# Patient Record
Sex: Female | Born: 1960 | Race: White | Hispanic: No | Smoking: Current every day smoker
Health system: Southern US, Community
[De-identification: ages and names within clinical notes are randomized; demographics above are authoritative.]

## PROBLEM LIST (undated history)

## (undated) DIAGNOSIS — F319 Bipolar disorder, unspecified: Secondary | ICD-10-CM

## (undated) DIAGNOSIS — F419 Anxiety disorder, unspecified: Secondary | ICD-10-CM

## (undated) DIAGNOSIS — M1990 Unspecified osteoarthritis, unspecified site: Secondary | ICD-10-CM

## (undated) DIAGNOSIS — F4310 Post-traumatic stress disorder, unspecified: Secondary | ICD-10-CM

## (undated) DIAGNOSIS — J45909 Unspecified asthma, uncomplicated: Secondary | ICD-10-CM

## (undated) DIAGNOSIS — J189 Pneumonia, unspecified organism: Secondary | ICD-10-CM

## (undated) DIAGNOSIS — R42 Dizziness and giddiness: Secondary | ICD-10-CM

## (undated) DIAGNOSIS — R519 Headache, unspecified: Secondary | ICD-10-CM

## (undated) DIAGNOSIS — R0600 Dyspnea, unspecified: Secondary | ICD-10-CM

## (undated) DIAGNOSIS — C801 Malignant (primary) neoplasm, unspecified: Secondary | ICD-10-CM

## (undated) DIAGNOSIS — D649 Anemia, unspecified: Secondary | ICD-10-CM

## (undated) HISTORY — PX: ANKLE FRACTURE SURGERY: SHX122

## (undated) HISTORY — PX: ABDOMINAL HYSTERECTOMY: SHX81

## (undated) HISTORY — PX: FOOT FRACTURE SURGERY: SHX645

## (undated) HISTORY — PX: KNEE SURGERY: SHX244

## (undated) HISTORY — PX: TONSILLECTOMY: SUR1361

---
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
## 2004-12-27 ENCOUNTER — Emergency Department: Payer: Self-pay | Admitting: Emergency Medicine

## 2006-11-24 ENCOUNTER — Emergency Department: Payer: Self-pay

## 2006-11-29 ENCOUNTER — Ambulatory Visit: Payer: Self-pay | Admitting: Nurse Practitioner

## 2007-05-21 ENCOUNTER — Emergency Department: Payer: Self-pay | Admitting: Emergency Medicine

## 2007-12-21 ENCOUNTER — Emergency Department: Payer: Self-pay | Admitting: Emergency Medicine

## 2007-12-23 ENCOUNTER — Emergency Department: Payer: Self-pay | Admitting: Emergency Medicine

## 2008-05-26 ENCOUNTER — Emergency Department: Payer: Self-pay | Admitting: Emergency Medicine

## 2008-08-22 ENCOUNTER — Emergency Department: Payer: Self-pay | Admitting: Emergency Medicine

## 2008-11-27 ENCOUNTER — Emergency Department: Payer: Self-pay | Admitting: Unknown Physician Specialty

## 2010-04-21 ENCOUNTER — Ambulatory Visit: Payer: Self-pay | Admitting: Family Medicine

## 2011-02-25 ENCOUNTER — Emergency Department: Payer: Self-pay | Admitting: Emergency Medicine

## 2011-06-04 ENCOUNTER — Emergency Department: Payer: Self-pay | Admitting: Emergency Medicine

## 2011-06-15 ENCOUNTER — Emergency Department: Payer: Self-pay | Admitting: *Deleted

## 2012-09-28 ENCOUNTER — Emergency Department: Payer: Self-pay | Admitting: Internal Medicine

## 2014-04-14 ENCOUNTER — Emergency Department: Payer: Self-pay | Admitting: Emergency Medicine

## 2014-04-14 LAB — BASIC METABOLIC PANEL
Anion Gap: 6 — ABNORMAL LOW (ref 7–16)
BUN: 9 mg/dL (ref 7–18)
Calcium, Total: 9.1 mg/dL (ref 8.5–10.1)
Chloride: 109 mmol/L — ABNORMAL HIGH (ref 98–107)
Co2: 26 mmol/L (ref 21–32)
Creatinine: 0.9 mg/dL (ref 0.60–1.30)
EGFR (African American): >60
GLUCOSE: 99 mg/dL (ref 65–99)
OSMOLALITY: 280 (ref 275–301)
POTASSIUM: 3.9 mmol/L (ref 3.5–5.1)
SODIUM: 141 mmol/L (ref 136–145)

## 2014-04-14 LAB — CBC
HCT: 37.8 % (ref 35.0–47.0)
HGB: 12.7 g/dL (ref 12.0–16.0)
MCH: 28.1 pg (ref 26.0–34.0)
MCHC: 33.7 g/dL (ref 32.0–36.0)
MCV: 83 fL (ref 80–100)
Platelet: 193 10*3/uL (ref 150–440)
RBC: 4.53 10*6/uL (ref 3.80–5.20)
RDW: 14.3 % (ref 11.5–14.5)
WBC: 7.3 10*3/uL (ref 3.6–11.0)

## 2014-04-14 LAB — TROPONIN I: Troponin-I: <0.02 ng/mL

## 2014-07-19 ENCOUNTER — Emergency Department: Payer: Self-pay | Admitting: Emergency Medicine

## 2015-12-22 ENCOUNTER — Encounter: Payer: Self-pay | Admitting: Emergency Medicine

## 2015-12-22 ENCOUNTER — Emergency Department
Admission: EM | Admit: 2015-12-22 | Discharge: 2015-12-22 | Disposition: A | Discharge status: Home / Self Care | Payer: Self-pay | Attending: Emergency Medicine | Admitting: Emergency Medicine

## 2015-12-22 DIAGNOSIS — F17210 Nicotine dependence, cigarettes, uncomplicated: Secondary | ICD-10-CM | POA: Insufficient documentation

## 2015-12-22 DIAGNOSIS — S61211A Laceration without foreign body of left index finger without damage to nail, initial encounter: Secondary | ICD-10-CM | POA: Insufficient documentation

## 2015-12-22 DIAGNOSIS — Y9289 Other specified places as the place of occurrence of the external cause: Secondary | ICD-10-CM | POA: Insufficient documentation

## 2015-12-22 DIAGNOSIS — S61219A Laceration without foreign body of unspecified finger without damage to nail, initial encounter: Secondary | ICD-10-CM

## 2015-12-22 DIAGNOSIS — Z23 Encounter for immunization: Secondary | ICD-10-CM | POA: Insufficient documentation

## 2015-12-22 DIAGNOSIS — Y9389 Activity, other specified: Secondary | ICD-10-CM | POA: Insufficient documentation

## 2015-12-22 DIAGNOSIS — Y998 Other external cause status: Secondary | ICD-10-CM | POA: Insufficient documentation

## 2015-12-22 DIAGNOSIS — W272XXA Contact with scissors, initial encounter: Secondary | ICD-10-CM | POA: Insufficient documentation

## 2015-12-22 MED ORDER — TETANUS-DIPHTH-ACELL PERTUSSIS 5-2.5-18.5 LF-MCG/0.5 IM SUSP
0.5000 mL | Freq: Once | INTRAMUSCULAR | Status: AC
  Administered 2015-12-22: 0.5 mL via INTRAMUSCULAR

## 2015-12-22 MED ORDER — TETANUS-DIPHTH-ACELL PERTUSSIS 5-2.5-18.5 LF-MCG/0.5 IM SUSP
INTRAMUSCULAR | Status: AC
  Administered 2015-12-22: 0.5 mL via INTRAMUSCULAR
  Filled 2015-12-22: qty 0.5

## 2015-12-22 NOTE — ED Notes (Signed)
Skin glue applied to left pointer finger. Pt in no acute distress at this time. Bleeding under control at this time.

## 2015-12-22 NOTE — Discharge Instructions (Signed)
Laceration Care, Adult A laceration is a cut that goes through all layers of the skin. The cut also goes into the tissue that is right under the skin. Some cuts heal on their own. Others need to be closed with stitches (sutures), staples, skin adhesive strips, or wound glue. Taking care of your cut lowers your risk of infection and helps your cut to heal better. HOW TO TAKE CARE OF YOUR CUT For stitches or staples:  Keep the wound clean and dry.  If you were given a bandage (dressing), you should change it at least one time per day or as told by your doctor. You should also change it if it gets wet or dirty.  Keep the wound completely dry for the first 24 hours or as told by your doctor. After that time, you may take a shower or a bath. However, make sure that the wound is not soaked in water until after the stitches or staples have been removed.  Clean the wound one time each day or as told by your doctor:  Wash the wound with soap and water.  Rinse the wound with water until all of the soap comes off.  Pat the wound dry with a clean towel. Do not rub the wound.  After you clean the wound, put a thin layer of antibiotic ointment on it as told by your doctor. This ointment:  Helps to prevent infection.  Keeps the bandage from sticking to the wound.  Have your stitches or staples removed as told by your doctor. If your doctor used skin adhesive strips:   Keep the wound clean and dry.  If you were given a bandage, you should change it at least one time per day or as told by your doctor. You should also change it if it gets dirty or wet.  Do not get the skin adhesive strips wet. You can take a shower or a bath, but be careful to keep the wound dry.  If the wound gets wet, pat it dry with a clean towel. Do not rub the wound.  Skin adhesive strips fall off on their own. You can trim the strips as the wound heals. Do not remove any strips that are still stuck to the wound. They will  fall off after a while. If your doctor used wound glue:  Try to keep your wound dry, but you may briefly wet it in the shower or bath. Do not soak the wound in water, such as by swimming.  After you take a shower or a bath, gently pat the wound dry with a clean towel. Do not rub the wound.  Do not do any activities that will make you really sweaty until the skin glue has fallen off on its own.  Do not apply liquid, cream, or ointment medicine to your wound while the skin glue is still on.  If you were given a bandage, you should change it at least one time per day or as told by your doctor. You should also change it if it gets dirty or wet.  If a bandage is placed over the wound, do not let the tape for the bandage touch the skin glue.  Do not pick at the glue. The skin glue usually stays on for 5-10 days. Then, it falls off of the skin. General Instructions  To help prevent scarring, make sure to cover your wound with sunscreen whenever you are outside after stitches are removed, after adhesive strips are removed,  or when wound glue stays in place and the wound is healed. Make sure to wear a sunscreen of at least 30 SPF.  Take over-the-counter and prescription medicines only as told by your doctor.  If you were given antibiotic medicine or ointment, take or apply it as told by your doctor. Do not stop using the antibiotic even if your wound is getting better.  Do not scratch or pick at the wound.  Keep all follow-up visits as told by your doctor. This is important.  Check your wound every day for signs of infection. Watch for:  Redness, swelling, or pain.  Fluid, blood, or pus.  Raise (elevate) the injured area above the level of your heart while you are sitting or lying down, if possible. GET HELP IF:  You got a tetanus shot and you have any of these problems at the injection site:  Swelling.  Very bad pain.  Redness.  Bleeding.  You have a fever.  A wound that was  closed breaks open.  You notice a bad smell coming from your wound or your bandage.  You notice something coming out of the wound, such as wood or glass.  Medicine does not help your pain.  You have more redness, swelling, or pain at the site of your wound.  You have fluid, blood, or pus coming from your wound.  You notice a change in the color of your skin near your wound.  You need to change the bandage often because fluid, blood, or pus is coming from the wound.  You start to have a new rash.  You start to have numbness around the wound. GET HELP RIGHT AWAY IF:  You have very bad swelling around the wound.  Your pain suddenly gets worse and is very bad.  You notice painful lumps near the wound or on skin that is anywhere on your body.  You have a red streak going away from your wound.  The wound is on your hand or foot and you cannot move a finger or toe like you usually can.  The wound is on your hand or foot and you notice that your fingers or toes look pale or bluish.   This information is not intended to replace advice given to you by your health care provider. Make sure you discuss any questions you have with your health care provider.   Document Released: 03/13/2008 Document Revised: 02/09/2015 Document Reviewed: 09/21/2014 Elsevier Interactive Patient Education 2016 [place], or Adhesive Wound Closure Doctors use stitches (sutures), staples, and certain glue (skin adhesives) to hold your skin together while it heals (wound closure). You may need this treatment after you have surgery or if you cut your skin accidentally. These methods help your skin heal more quickly. They also make it less likely that you will have a scar. WHAT ARE THE DIFFERENT KINDS OF WOUND CLOSURES? There are many options for wound closure. The one that your doctor uses depends on how deep and large your wound is. Adhesive Glue To use this glue to close a wound, your  doctor holds the edges of the wound together and paints the glue on the surface of your skin. You may need more than one layer of glue. Then the wound may be covered with a light bandage (dressing). This type of skin closure may be used for small wounds that are not deep (superficial). Using glue for wound closure is less painful than other methods. It does not require  a medicine that numbs the area. This method also leaves nothing to be removed. Adhesive glue is often used for children and on facial wounds. Adhesive glue cannot be used for wounds that are deep, uneven, or bleeding. It is not used inside of a wound.  Adhesive Strips These strips are made of sticky (adhesive), porous paper. They are placed across your skin edges like a regular adhesive bandage. You leave them on until they fall off. Adhesive strips may be used to close very superficial wounds. They may also be used along with sutures to improve closure of your skin edges.  Sutures Sutures are the oldest method of wound closure. Sutures can be made from natural or synthetic materials. They can be made from a material that your body can break down as your wound heals (absorbable), or they can be made from a material that needs to be removed from your skin (nonabsorbable). They come in many different strengths and sizes. Your doctor attaches the sutures to a steel needle on one end. Sutures can be passed through your skin, or through the tissues beneath your skin. Then they are tied and cut. Your skin edges may be closed in one continuous stitch or in separate stitches. Sutures are strong and can be used for all kinds of wounds. Absorbable sutures may be used to close tissues under the skin. The disadvantage of sutures is that they may cause skin reactions that lead to infection. Nonabsorbable sutures need to be removed. Staples When surgical staples are used to close a wound, the edges of your skin on both sides of the wound are brought  close together. A staple is placed across the wound, and an instrument secures the edges together. Staples are often used to close surgical cuts (incisions). Staples are faster to use than sutures, and they cause less reaction from your skin. Staples need to be removed using a tool that bends the staples away from your skin. HOW DO I CARE FOR MY WOUND CLOSURE?  Take medicines only as told by your doctor.  If you were prescribed an antibiotic medicine for your wound, finish it all even if you start to feel better.  Use ointments or creams only as told by your doctor.  Wash your hands with soap and water before and after touching your wound.  Do not soak your wound in water. Do not take baths, swim, or use a hot tub until your doctor says it is okay.  Ask your doctor when you can start showering. Cover your wound if told by your doctor.  Do not take out your own sutures or staples.  Do not pick at your wound. Picking can cause an infection.  Keep all follow-up visits as told by your doctor. This is important. HOW LONG WILL I HAVE MY WOUND CLOSURE?   Leave adhesive glue on your skin until the glue peels away.  Leave adhesive strips on your skin until they fall off.  Absorbable sutures will dissolve within several days.  Nonabsorbable sutures and staples must be removed. The location of the wound will determine how long they stay in. This can range from several days to a couple of weeks. WHEN SHOULD I SEEK HELP FOR MY WOUND CLOSURE? Contact your doctor if:  You have a fever.  You have chills.  You have redness, puffiness (swelling), or pain at the site of your wound.  You have fluid, blood, or pus coming from your wound.  There is a bad smell coming  from your wound.  The skin edges of your wound start to separate after your sutures have been removed.  Your wound becomes thick, raised, and darker in color after your sutures come out (scarring).   This information is not  intended to replace advice given to you by your health care provider. Make sure you discuss any questions you have with your health care provider.   Document Released: 07/23/2009 Document Revised: 10/16/2014 Document Reviewed: 03/04/2014 Elsevier Interactive Patient Education [employer].

## 2015-12-22 NOTE — ED Provider Notes (Signed)
[facility] Emergency Department Provider Note ____________________________________________  Time seen: 2250  I have reviewed the triage vital signs and the nursing notes.  HISTORY  Chief Complaint  Laceration  HPI Catherine Pollard is a 55 y.o. female since the ED for evaluation of a simple laceration to left index finger. She describes an accidental, self-inflicted laceration to the lateral aspect of her left index finger with a pair of scissors. She initially had difficulty getting the wound to stop bleeding, but now reports the wound is not actively bleeding. She is also unclear of her tetanus status. She denies any other injury at this time. She reportsdiscomfort a 4/10 in triage.  History reviewed. No pertinent past medical history.  There are no active problems to display for this patient.  History reviewed. No pertinent past surgical history.  No current outpatient prescriptions on file.  Allergies Review of patient's allergies indicates no known allergies.  History reviewed. No pertinent family history.  Social History Social History  Substance Use Topics  . Smoking status: Current Every Day Smoker -- 0.50 packs/day    Types: Cigarettes  . Smokeless tobacco: None  . Alcohol Use: None   Review of Systems  Constitutional: Negative for fever. Musculoskeletal: Negative for back pain. Skin: Negative for rash. Left index lack as above. Neurological: Negative for headaches, focal weakness or numbness. ____________________________________________  PHYSICAL EXAM:  VITAL SIGNS: ED Triage Vitals  Enc Vitals Group     BP 12/22/15 2217 109/65 mmHg     Pulse Rate 12/22/15 2217 81     Resp 12/22/15 2217 18     Temp 12/22/15 2217 98.1 F (36.7 C)     Temp Source 12/22/15 2217 Oral     SpO2 12/22/15 2217 97 %     Weight 12/22/15 2217 197 lb 11.2 oz (89.676 kg)     Height 12/22/15 2217 5\' 5"  (1.651 m)     Head Cir --      Peak Flow --       Pain Score 12/22/15 2241 4     Pain Loc --      Pain Edu? --      Excl. in [place]? --    Constitutional: Alert and oriented. Well appearing and in no distress. Head: Normocephalic and atraumatic. Eyes: Conjunctivae are normal. PERRL. Normal extraocular movements Hematological/Lymphatic/Immunological: No cervical lymphadenopathy. Cardiovascular: Normal rate, regular rhythm. Normal distal pulses and normal capillary refill. Respiratory: Normal respiratory effort.  Musculoskeletal: Left index finger with normal flexion and extension range and exam. Nontender with normal range of motion in all extremities.  Neurologic:  Normal gait without ataxia. Normal speech and language. No gross focal neurologic deficits are appreciated. Skin:  Skin is warm, dry and intact. No rash noted. Patient with a simple linear laceration to the lateral aspect of the left index finger at the distal phalanx. The laceration measures approximately 1 cm in length. No active bleeding is noted. Psychiatric: Mood and affect are normal. Patient exhibits appropriate insight and judgment. ___________________________________________  PROCEDURES  Tdap  LACERATION REPAIR Performed by: Melvenia Needles Authorized by: Melvenia Needles Consent: Verbal consent obtained. Risks and benefits: risks, benefits and alternatives were discussed Consent given by: patient Patient identity confirmed: provided demographic data Prepped and Draped in normal sterile fashion Wound explored  Laceration Location: left index  Laceration Length: 1 cm  No Foreign Bodies seen or palpated  Amount of cleaning: standard  Skin closure: wound adhesive  Patient tolerance:  Patient tolerated the procedure well with no immediate complications. ____________________________________________  INITIAL IMPRESSION / ASSESSMENT AND PLAN / ED COURSE  Patient with a simple finger laceration status post wound adhesive repair. She is advised  on wound care struck chin and management including avoidance of oils, ointments, and lotions to the glue. She'll follow-up with primary care provider for ongoing symptom management. ____________________________________________  FINAL CLINICAL IMPRESSION(S) / ED DIAGNOSES  Final diagnoses:  Finger laceration, initial encounter      Melvenia Needles, PA-C 12/22/15 [place], MD 12/23/15 [phone]
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
## 2017-11-10 ENCOUNTER — Emergency Department: Payer: Self-pay

## 2017-11-10 ENCOUNTER — Emergency Department
Admission: EM | Admit: 2017-11-10 | Discharge: 2017-11-10 | Disposition: A | Discharge status: Home / Self Care | Payer: Self-pay | Attending: Emergency Medicine | Admitting: Emergency Medicine

## 2017-11-10 ENCOUNTER — Encounter: Payer: Self-pay | Admitting: Emergency Medicine

## 2017-11-10 ENCOUNTER — Other Ambulatory Visit: Payer: Self-pay

## 2017-11-10 DIAGNOSIS — Y92410 Unspecified street and highway as the place of occurrence of the external cause: Secondary | ICD-10-CM | POA: Insufficient documentation

## 2017-11-10 DIAGNOSIS — Y999 Unspecified external cause status: Secondary | ICD-10-CM | POA: Insufficient documentation

## 2017-11-10 DIAGNOSIS — M7918 Myalgia, other site: Secondary | ICD-10-CM | POA: Insufficient documentation

## 2017-11-10 DIAGNOSIS — F17210 Nicotine dependence, cigarettes, uncomplicated: Secondary | ICD-10-CM | POA: Insufficient documentation

## 2017-11-10 DIAGNOSIS — Y9389 Activity, other specified: Secondary | ICD-10-CM | POA: Insufficient documentation

## 2017-11-10 HISTORY — DX: Dizziness and giddiness: R42

## 2017-11-10 MED ORDER — CYCLOBENZAPRINE HCL 5 MG PO TABS: ORAL_TABLET | ORAL | 0 refills | Status: AC

## 2017-11-10 MED ORDER — KETOROLAC TROMETHAMINE 30 MG/ML IJ SOLN
30.0000 mg | Freq: Once | INTRAMUSCULAR | Status: AC
  Administered 2017-11-10: 30 mg via INTRAMUSCULAR
  Filled 2017-11-10: qty 1

## 2017-11-10 MED ORDER — IBUPROFEN 800 MG PO TABS: 800.0000 mg | ORAL_TABLET | Freq: Three times a day (TID) | ORAL | 0 refills | Status: DC | PRN
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
## 2018-06-12 ENCOUNTER — Ambulatory Visit: Payer: Self-pay

## 2018-09-17 ENCOUNTER — Inpatient Hospital Stay
Admission: EM | Admit: 2018-09-17 | Discharge: 2018-09-18 | DRG: 193 | Disposition: A | Discharge status: Home / Self Care | Payer: Self-pay | Attending: Internal Medicine | Admitting: Internal Medicine

## 2018-09-17 ENCOUNTER — Emergency Department: Payer: Self-pay

## 2018-09-17 ENCOUNTER — Other Ambulatory Visit: Payer: Self-pay

## 2018-09-17 ENCOUNTER — Encounter: Payer: Self-pay | Admitting: Emergency Medicine

## 2018-09-17 DIAGNOSIS — S01512A Laceration without foreign body of oral cavity, initial encounter: Secondary | ICD-10-CM | POA: Diagnosis present

## 2018-09-17 DIAGNOSIS — I493 Ventricular premature depolarization: Secondary | ICD-10-CM | POA: Diagnosis present

## 2018-09-17 DIAGNOSIS — E860 Dehydration: Secondary | ICD-10-CM | POA: Diagnosis present

## 2018-09-17 DIAGNOSIS — E876 Hypokalemia: Secondary | ICD-10-CM | POA: Diagnosis present

## 2018-09-17 DIAGNOSIS — Z885 Allergy status to narcotic agent status: Secondary | ICD-10-CM

## 2018-09-17 DIAGNOSIS — F17210 Nicotine dependence, cigarettes, uncomplicated: Secondary | ICD-10-CM | POA: Diagnosis present

## 2018-09-17 DIAGNOSIS — J9601 Acute respiratory failure with hypoxia: Secondary | ICD-10-CM | POA: Diagnosis present

## 2018-09-17 DIAGNOSIS — R0902 Hypoxemia: Secondary | ICD-10-CM

## 2018-09-17 DIAGNOSIS — F329 Major depressive disorder, single episode, unspecified: Secondary | ICD-10-CM | POA: Diagnosis present

## 2018-09-17 DIAGNOSIS — W1839XA Other fall on same level, initial encounter: Secondary | ICD-10-CM | POA: Diagnosis present

## 2018-09-17 DIAGNOSIS — R55 Syncope and collapse: Secondary | ICD-10-CM | POA: Diagnosis present

## 2018-09-17 DIAGNOSIS — F419 Anxiety disorder, unspecified: Secondary | ICD-10-CM | POA: Diagnosis present

## 2018-09-17 DIAGNOSIS — I959 Hypotension, unspecified: Secondary | ICD-10-CM | POA: Diagnosis present

## 2018-09-17 DIAGNOSIS — J9801 Acute bronchospasm: Secondary | ICD-10-CM | POA: Diagnosis present

## 2018-09-17 DIAGNOSIS — D61818 Other pancytopenia: Secondary | ICD-10-CM | POA: Diagnosis present

## 2018-09-17 DIAGNOSIS — G8929 Other chronic pain: Secondary | ICD-10-CM | POA: Diagnosis present

## 2018-09-17 DIAGNOSIS — Y92002 Bathroom of unspecified non-institutional (private) residence single-family (private) house as the place of occurrence of the external cause: Secondary | ICD-10-CM

## 2018-09-17 DIAGNOSIS — Z79899 Other long term (current) drug therapy: Secondary | ICD-10-CM

## 2018-09-17 DIAGNOSIS — S0083XA Contusion of other part of head, initial encounter: Secondary | ICD-10-CM | POA: Diagnosis present

## 2018-09-17 DIAGNOSIS — J101 Influenza due to other identified influenza virus with other respiratory manifestations: Principal | ICD-10-CM | POA: Diagnosis present

## 2018-09-17 HISTORY — DX: Anxiety disorder, unspecified: F41.9

## 2018-09-17 LAB — INFLUENZA PANEL BY PCR (TYPE A & B)
Influenza A By PCR: POSITIVE — AB
Influenza B By PCR: NEGATIVE

## 2018-09-17 LAB — TROPONIN I: Troponin I: <0.03 ng/mL (ref <0.03)

## 2018-09-17 LAB — BASIC METABOLIC PANEL
Anion gap: 8 (ref 5–15)
BUN: 11 mg/dL (ref 6–20)
CALCIUM: 7.7 mg/dL — AB (ref 8.9–10.3)
CO2: 22 mmol/L (ref 22–32)
CREATININE: 0.96 mg/dL (ref 0.44–1.00)
Chloride: 107 mmol/L (ref 98–111)
GFR calc non Af Amer: >60 mL/min (ref >60)
Glucose, Bld: 117 mg/dL — ABNORMAL HIGH (ref 70–99)
Potassium: 3.3 mmol/L — ABNORMAL LOW (ref 3.5–5.1)
Sodium: 137 mmol/L (ref 135–145)

## 2018-09-17 LAB — CBC
HEMATOCRIT: 36.6 % (ref 36.0–46.0)
Hemoglobin: 11.7 g/dL — ABNORMAL LOW (ref 12.0–15.0)
MCH: 28.1 pg (ref 26.0–34.0)
MCHC: 32 g/dL (ref 30.0–36.0)
MCV: 87.8 fL (ref 80.0–100.0)
NRBC: 0 % (ref 0.0–0.2)
Platelets: 134 10*3/uL — ABNORMAL LOW (ref 150–400)
RBC: 4.17 MIL/uL (ref 3.87–5.11)
RDW: 14.1 % (ref 11.5–15.5)
WBC: 4.4 10*3/uL (ref 4.0–10.5)

## 2018-09-17 MED ORDER — FENTANYL CITRATE (PF) 100 MCG/2ML IJ SOLN
50.0000 ug | Freq: Once | INTRAMUSCULAR | Status: AC
  Administered 2018-09-17: 50 ug via INTRAVENOUS
  Filled 2018-09-17: qty 2

## 2018-09-17 MED ORDER — OSELTAMIVIR PHOSPHATE 75 MG PO CAPS
75.0000 mg | ORAL_CAPSULE | Freq: Two times a day (BID) | ORAL | Status: DC
  Administered 2018-09-17 – 2018-09-18 (×2): 75 mg via ORAL
  Filled 2018-09-17 (×3): qty 1

## 2018-09-17 MED ORDER — DOCUSATE SODIUM 100 MG PO CAPS
100.0000 mg | ORAL_CAPSULE | Freq: Two times a day (BID) | ORAL | Status: DC
  Administered 2018-09-17: 100 mg via ORAL
  Filled 2018-09-17 (×2): qty 1

## 2018-09-17 MED ORDER — BISACODYL 5 MG PO TBEC
5.0000 mg | DELAYED_RELEASE_TABLET | Freq: Every day | ORAL | Status: DC | PRN
  Filled 2018-09-17: qty 1

## 2018-09-17 MED ORDER — ENOXAPARIN SODIUM 40 MG/0.4ML ~~LOC~~ SOLN
40.0000 mg | SUBCUTANEOUS | Status: DC
  Administered 2018-09-17: 40 mg via SUBCUTANEOUS
  Filled 2018-09-17: qty 0.4

## 2018-09-17 MED ORDER — BUSPIRONE HCL 5 MG PO TABS
5.0000 mg | ORAL_TABLET | Freq: Every day | ORAL | Status: DC
  Administered 2018-09-17 – 2018-09-18 (×2): 5 mg via ORAL
  Filled 2018-09-17 (×2): qty 1

## 2018-09-17 MED ORDER — SODIUM CHLORIDE 0.9 % IV BOLUS
1000.0000 mL | Freq: Once | INTRAVENOUS | Status: AC
  Administered 2018-09-17: 1000 mL via INTRAVENOUS

## 2018-09-17 MED ORDER — NICOTINE 21 MG/24HR TD PT24
21.0000 mg | MEDICATED_PATCH | Freq: Every day | TRANSDERMAL | Status: DC
  Filled 2018-09-17: qty 1

## 2018-09-17 MED ORDER — ALBUTEROL SULFATE (2.5 MG/3ML) 0.083% IN NEBU
2.5000 mg | INHALATION_SOLUTION | Freq: Once | RESPIRATORY_TRACT | Status: AC
  Administered 2018-09-17: 2.5 mg via RESPIRATORY_TRACT
  Filled 2018-09-17: qty 3

## 2018-09-17 MED ORDER — POTASSIUM CHLORIDE CRYS ER 20 MEQ PO TBCR
40.0000 meq | EXTENDED_RELEASE_TABLET | ORAL | Status: AC
  Administered 2018-09-17 (×2): 40 meq via ORAL
  Filled 2018-09-17 (×2): qty 2

## 2018-09-17 MED ORDER — ACETAMINOPHEN 325 MG PO TABS
650.0000 mg | ORAL_TABLET | Freq: Four times a day (QID) | ORAL | Status: DC | PRN
  Administered 2018-09-18: 650 mg via ORAL
  Filled 2018-09-17: qty 2

## 2018-09-17 MED ORDER — ACETAMINOPHEN 500 MG PO TABS: 1000.0000 mg | ORAL_TABLET | Freq: Once | ORAL | Status: DC

## 2018-09-17 MED ORDER — ACETAMINOPHEN 500 MG PO TABS
ORAL_TABLET | ORAL | Status: AC
  Filled 2018-09-17: qty 2

## 2018-09-17 MED ORDER — IPRATROPIUM-ALBUTEROL 0.5-2.5 (3) MG/3ML IN SOLN
3.0000 mL | Freq: Four times a day (QID) | RESPIRATORY_TRACT | Status: DC
  Administered 2018-09-17 – 2018-09-18 (×3): 3 mL via RESPIRATORY_TRACT
  Filled 2018-09-17 (×3): qty 3

## 2018-09-17 MED ORDER — ONDANSETRON HCL 4 MG/2ML IJ SOLN
4.0000 mg | Freq: Once | INTRAMUSCULAR | Status: AC
  Administered 2018-09-17: 4 mg via INTRAVENOUS
  Filled 2018-09-17: qty 2

## 2018-09-17 MED ORDER — ONDANSETRON HCL 4 MG/2ML IJ SOLN: 4.0000 mg | Freq: Four times a day (QID) | INTRAMUSCULAR | Status: DC | PRN

## 2018-09-17 MED ORDER — ONDANSETRON HCL 4 MG PO TABS: 4.0000 mg | ORAL_TABLET | Freq: Four times a day (QID) | ORAL | Status: DC | PRN

## 2018-09-17 MED ORDER — DULOXETINE HCL 30 MG PO CPEP
60.0000 mg | ORAL_CAPSULE | Freq: Every day | ORAL | Status: DC
  Administered 2018-09-17 – 2018-09-18 (×2): 60 mg via ORAL
  Filled 2018-09-17: qty 2
  Filled 2018-09-17: qty 1

## 2018-09-17 MED ORDER — SODIUM CHLORIDE 0.9 % IV SOLN
INTRAVENOUS | Status: DC
  Administered 2018-09-17 – 2018-09-18 (×2): via INTRAVENOUS

## 2018-09-17 MED ORDER — MECLIZINE HCL 25 MG PO TABS
25.0000 mg | ORAL_TABLET | Freq: Two times a day (BID) | ORAL | Status: DC | PRN
  Filled 2018-09-17: qty 1

## 2018-09-17 MED ORDER — ACETAMINOPHEN 500 MG PO TABS
1000.0000 mg | ORAL_TABLET | Freq: Once | ORAL | Status: AC
  Administered 2018-09-17: 1000 mg via ORAL

## 2018-09-17 NOTE — ED Provider Notes (Addendum)
[facility] Emergency Department Provider Note ____________________________________________   I have reviewed the triage vital signs and the triage nursing note.  HISTORY  Chief Complaint Loss of Consciousness   Historian Patient  HPI Catherine Pollard is a 57 y.o. female presented after syncopal episode this morning.  States that she got up this morning after several days of cough and not feeling well, felt lightheaded and sweaty and then passed out in the bathroom striking her head against the concrete floor.  She has chronic neck pain and states it slightly worse than typical.  She has pain at the right zygomatic arch area.  She bites her lip and has a little bit of dried blood that appears to have come from the inside of her lower lip.  No chest pain.  She has had a cough.  No fever.  No leg swelling.  No history of syncope.  No vomiting or diarrhea.     Past Medical History:  Diagnosis Date  . Anxiety   . Vertigo     There are no active problems to display for this patient.   History reviewed. No pertinent surgical history.  Prior to Admission medications   Medication Sig Start Date End Date Taking? Authorizing Provider  busPIRone (BUSPAR) 5 MG tablet Take 5 mg by mouth daily.   Yes [provider]  cyclobenzaprine (FLEXERIL) 5 MG tablet Take 1-2 tablets 3 times daily as needed 11/10/17  Yes Laban Emperor, PA-C  DULoxetine (CYMBALTA) 60 MG capsule Take 60 mg by mouth daily.   Yes [provider]  ibuprofen (ADVIL,MOTRIN) 800 MG tablet Take 1 tablet (800 mg total) by mouth every 8 (eight) hours as needed. 11/10/17  Yes Laban Emperor, PA-C  meclizine (ANTIVERT) 25 MG tablet Take 25 mg by mouth every 6 (six) hours as needed.    Yes [provider]    Allergies  Allergen Reactions  . Vicodin [Hydrocodone-Acetaminophen]     itching    No family history on file.  Social History Social History   Tobacco Use   . Smoking status: Current Every Day Smoker    Packs/day: 0.50    Types: Cigarettes  . Smokeless tobacco: Never Used  Substance Use Topics  . Alcohol use: Not Currently  . Drug use: No    Review of Systems  Constitutional: Negative for fever. Eyes: Negative for visual changes. ENT: Negative for sore throat. Cardiovascular: Negative for chest pain. Respiratory: Negative for shortness of breath.  Positive for cough. Gastrointestinal: Negative for abdominal pain, vomiting and diarrhea. Genitourinary: Negative for dysuria. Musculoskeletal: Negative for back pain.  Positive for neck pain as per HPI. Skin: Negative for rash. Neurological: Negative for headache.  ____________________________________________   PHYSICAL EXAM:  VITAL SIGNS: ED Triage Vitals  Enc Vitals Group     BP 09/17/18 0956 94/71     Pulse Rate 09/17/18 0956 93     Resp 09/17/18 0956 20     Temp 09/17/18 0956 98.8 F (37.1 C)     Temp Source 09/17/18 0956 Oral     SpO2 09/17/18 0956 99 %     Weight 09/17/18 0958 209 lb (94.8 kg)     Height 09/17/18 0958 5\' 5"  (1.651 m)     Head Circumference --      Peak Flow --      Pain Score 09/17/18 0957 10     Pain Loc --      Pain Edu? --  Excl. in [place]? --      Constitutional: Alert and oriented.  HEENT      Head: Swelling and tenderness to the right zygomatic arch area.      Eyes: Conjunctivae are normal. Pupils equal and round.       Ears:         Nose: No congestion/rhinnorhea.      Mouth/Throat: Mucous membranes are moist.  Hemostatic bruise/small laceration to the inner lower lip.      Neck: No stridor.  No posterior step-off but she does have posterior midline tenderness to palpation. Cardiovascular/Chest: Normal rate, regular rhythm.  No murmurs, rubs, or gallops. Respiratory: Normal respiratory effort without tachypnea nor retractions. Breath sounds are clear and equal bilaterally. No wheezes/rales/rhonchi. Gastrointestinal: Soft. No distention,  no guarding, no rebound. Nontender.    Genitourinary/rectal:Deferred Musculoskeletal: Nontender with normal range of motion in all extremities. No joint effusions.  No lower extremity tenderness.  No edema. Neurologic:  Normal speech and language. No gross or focal neurologic deficits are appreciated. Skin:  Skin is warm, dry and intact. No rash noted. Psychiatric: Mood and affect are normal. Speech and behavior are normal. Patient exhibits appropriate insight and judgment.   ____________________________________________  LABS (pertinent positives/negatives) I, Lisa Roca, MD the attending physician have reviewed the labs noted below.  Labs Reviewed  BASIC METABOLIC PANEL - Abnormal; Notable for the following components:      Result Value   Potassium 3.3 (*)    Glucose, Bld 117 (*)    Calcium 7.7 (*)    All other components within normal limits  CBC - Abnormal; Notable for the following components:   Hemoglobin 11.7 (*)    Platelets 134 (*)    All other components within normal limits  INFLUENZA PANEL BY PCR (TYPE A & B) - Abnormal; Notable for the following components:   Influenza A By PCR POSITIVE (*)    All other components within normal limits  TROPONIN I    ____________________________________________    EKG I, Lisa Roca, MD, the attending physician have personally viewed and interpreted all ECGs.  51 bpm.  Normal sinus rhythm.  Slightly wavy interference baseline, but no significant ST changes. ____________________________________________  RADIOLOGY   CT head, cervical spine and maxillofacial CTs:  CT CERVICAL SPINE FINDINGS  Alignment: Straightening of the usual lordosis. Minimal degenerative grade 1 spondylolisthesis of C5 on C6 measuring 3 mm.  Skull base and vertebrae: No fractures identified involving the cervical spine. Facet joints anatomically aligned throughout with severe diffuse degenerative changes. Coronal reformatted images demonstrate an  intact craniocervical junction, intact dens and intact lateral masses throughout. Degenerative changes at the C1-C2 articulation.  Soft tissues and spinal canal: No evidence of paraspinous or spinal canal hematoma. No evidence of spinal stenosis.  Disc levels: Disc spaces well-preserved throughout the cervical spine. No visible significant disc protrusions.  Upper chest: Visualized lung apices clear. Visualized superior mediastinum normal.  Other: None.  IMPRESSION: 1. No acute intracranial abnormality. 2. No facial bone fractures identified. 3. No cervical spine fractures identified. 4. Severe facet degenerative changes involving the cervical spine as detailed above.  Chest x-ray:  IMPRESSION: No active disease. __________________________________________  PROCEDURES  Procedure(s) performed: None  Procedures  Critical Care performed: None   ____________________________________________  ED COURSE / ASSESSMENT AND PLAN  Pertinent labs & imaging results that were available during my care of the patient were reviewed by me and considered in my medical decision making (see chart for details).  Patient evaluated for traumatic injury after syncope, no serious injury suspected after CT head, maxillofacial and CT spine return.  She does have a lower lip internal laceration which I do not think needs stitches.  Syncope suspected due to orthostatic hypotension due to decreased p.o. intake.  She was hydrated here after 2 L of fluid, no longer orthostatic.  None be influenza A positive.  Symptoms been ongoing more than 48 hours at this point, we discussed risk and benefit and chose not to proceed with any Tamiflu.  Patient okay for discharge home.  We discussed conservative supportive management.    CONSULTATIONS:   Hospitalist for admission   Patient / Family / Caregiver informed of clinical course, medical decision-making process, and agree with  plan.    Addended: Patient had been placed on 2 L nasal cannula while she was sleeping her O2 sat had dropped, however even off oxygen and awake her O2 sat is around 84 to 85%.  Listening to her again she is having some end expiratory wheezing.  She does state that she was previous [place] diagnosed with asthma.  I think between the influenza A respiratory infection, this has created some rhonchal spasm and she is only given albuterol.  She is going need hospital admission due to the hypoxia.    ___________________________________________   FINAL CLINICAL IMPRESSION(S) / ED DIAGNOSES   Final diagnoses:  Syncope, unspecified syncope type  Laceration of oral cavity, initial encounter  Facial contusion, initial encounter  Influenza A  Bronchospasm  Hypoxia      ___________________________________________         Note: This dictation was prepared with Dragon dictation. Any transcriptional errors that result from this process are unintentional    Lisa Roca, MD 09/17/18 1518    Lisa Roca, MD 09/17/18 1536    Lisa Roca, MD 09/17/18 1536

## 2018-09-17 NOTE — ED Notes (Signed)
Hospitalist to bedside.

## 2018-09-17 NOTE — ED Notes (Signed)
Verbal permission given by patient to speak with daughter, Estill Bamberg, regarding patient care and results. Cell: [phone] Work: [phone]

## 2018-09-17 NOTE — ED Notes (Signed)
Pt ambulatory to toilet with standby assist.  Pt denies any dizziness at this time.

## 2018-09-17 NOTE — ED Triage Notes (Signed)
Patient from home via [facility]. Reports she woke up this morning soaking wet from sweat and went to the bathroom to wash off and when she bent down over the tub she passed out, hitting her head on the tub and floor. Patient states she is not sure how long she was unconscious for. Patient reports she has been sick with flu-like symptoms lately with intermittent fevers. Patient with dried blood and swelling to right side of bottom lip. Scrape and swelling noted under right eye.

## 2018-09-17 NOTE — ED Notes (Signed)
Pt oxygen saturation down to 69% on room air.  Upon assessment, pt found to be sleeping.  Pt placed on 3L nasal cannula to maintain saturation >90%.  EDP notified.

## 2018-09-17 NOTE — ED Notes (Signed)
EDP to bedside to provide patient and family with update. 

## 2018-09-17 NOTE — H&P (Signed)
Catherine Pollard at [place] NAME: Catherine Pollard    MR#:  [id number]  DATE OF BIRTH:  15-Feb-1961  DATE OF ADMISSION:  09/17/2018  PRIMARY CARE PHYSICIAN: [facility]   REQUESTING/REFERRING PHYSICIAN: Lisa Roca  CHIEF COMPLAINT: Loss of consciousness   Chief Complaint  Patient presents with  . Loss of Consciousness    HISTORY OF PRESENT ILLNESS:  Catherine Pollard  is a 57 y.o. female with a known history depression, heavy tobacco abuse comes in because of syncope.  Patient got up this morning and felt lightheaded and then passed out in the bathroom striking her head against concrete floor.  Has been having cough, diarrhea and low-grade temperature since Saturday.  Patient found to have hypotension when she arrived, received 2 L of fluid in the emergency room.  Blood test showed she has type influenza, hypokalemia but rest of the blood work is essentially normal.  Patient is hypoxic, room air saturation 69%, now on 3 L, saturation around 95%.  Patient was feeling sick with cough, cold, flulike symptoms with body aches, throat pain since Saturday.  PAST MEDICAL HISTORY:   Past Medical History:  Diagnosis Date  . Anxiety   . Vertigo     PAST SURGICAL HISTOIRY:  History reviewed. No pertinent surgical history.  SOCIAL HISTORY:   Social History   Tobacco Use  . Smoking status: Current Every Day Smoker    Packs/day: 0.50    Types: Cigarettes  . Smokeless tobacco: Never Used  Substance Use Topics  . Alcohol use: Not Currently    FAMILY HISTORY:  No family history on file.  DRUG ALLERGIES:   Allergies  Allergen Reactions  . Vicodin [Hydrocodone-Acetaminophen]     itching    REVIEW OF SYSTEMS:  CONSTITUTIONAL: Generalized body aches, fatigue. EYES: No blurred or double vision.  EARS, NOSE, AND THROAT: No tinnitus or ear pain.  RESPIRATORY: Dry cough, hypoxia O2 sat 69% on room air when she  came CARDIOVASCULAR: No chest pain, orthopnea, edema.  GASTROINTESTINAL: No nausea, vomiting but had diarrhea GENITOURINARY: No dysuria, hematuria.  ENDOCRINE: No polyuria, nocturia,  HEMATOLOGY: No anemia, easy bruising or bleeding SKIN: No rash or lesion. MUSCULOSKELETAL: No joint pain or arthritis.  Complains of headache NEUROLOGIC: No tingling, numbness, weakness.  PSYCHIATRY: No anxiety or depression.   MEDICATIONS AT HOME:   Prior to Admission medications   Medication Sig Start Date End Date Taking? Authorizing Provider  busPIRone (BUSPAR) 5 MG tablet Take 5 mg by mouth daily.   Yes [provider]  cyclobenzaprine (FLEXERIL) 5 MG tablet Take 1-2 tablets 3 times daily as needed 11/10/17  Yes Laban Emperor, PA-C  DULoxetine (CYMBALTA) 60 MG capsule Take 60 mg by mouth daily.   Yes [provider]  ibuprofen (ADVIL,MOTRIN) 800 MG tablet Take 1 tablet (800 mg total) by mouth every 8 (eight) hours as needed. 11/10/17  Yes Laban Emperor, PA-C  meclizine (ANTIVERT) 25 MG tablet Take 25 mg by mouth every 6 (six) hours as needed.    Yes [provider]      VITAL SIGNS:  Blood pressure 103/63, pulse 78, temperature 98.8 F (37.1 C), temperature source Oral, resp. rate 18, height 5\' 5"  (1.651 m), weight 94.8 kg, SpO2 93 %.  PHYSICAL EXAMINATION:  GENERAL:  57 y.o.-year-old patient lying in the bed with no acute distress.  EYES: Pupils equal, round, reactive to light and accommodation. No scleral icterus. Extraocular muscles  intact.  Mucosa dry.  Patient noted to have a small laceration on the inner lower lip. HEENT: Head atraumatic, normocephalic. Oropharynx and nasopharynx clear.  NECK:  Supple, no jugular venous distention. No thyroid enlargement, no tenderness.  LUNGS: Normal breath sounds bilaterally, no wheezing, rales,rhonchi or crepitation. No use of accessory muscles of respiration.  CARDIOVASCULAR: S1, S2 normal. No murmurs, rubs, or gallops.  ABDOMEN:  Soft, nontender, nondistended. Bowel sounds present. No organomegaly or mass.  EXTREMITIES: No pedal edema, cyanosis, or clubbing.  NEUROLOGIC: Cranial nerves II through XII are intact. Muscle strength 5/5 in all extremities. Sensation intact. Gait not checked.  PSYCHIATRIC: The patient is alert and oriented x 3.  SKIN: No obvious rash, lesion, or ulcer.   LABORATORY PANEL:   CBC Recent Labs  Lab 09/17/18 1008  WBC 4.4  HGB 11.7*  HCT 36.6  PLT 134*   ------------------------------------------------------------------------------------------------------------------  Chemistries  Recent Labs  Lab 09/17/18 1008  NA 137  K 3.3*  CL 107  CO2 22  GLUCOSE 117*  BUN 11  CREATININE 0.96  CALCIUM 7.7*   ------------------------------------------------------------------------------------------------------------------  Cardiac Enzymes Recent Labs  Lab 09/17/18 1008  TROPONINI <0.03   ------------------------------------------------------------------------------------------------------------------  RADIOLOGY:  Ct Head Wo Contrast  Result Date: 09/17/2018 CLINICAL DATA:  57 year old who awakened this morning soap with sweat from overnight. When she went to her bathroom at home and bent over her bathtub, she had a syncopal episode, striking her head on the bathtub and floor. Patient unsure of how long she lost consciousness. Bloody, swollen bottom lip and abrasion under the RIGHT eye. Initial encounter. EXAM: CT HEAD WITHOUT CONTRAST CT MAXILLOFACIAL WITHOUT CONTRAST CT CERVICAL SPINE WITHOUT CONTRAST TECHNIQUE: Multidetector CT imaging of the head, cervical spine, and maxillofacial structures were performed using the standard protocol without intravenous contrast. Multiplanar CT image reconstructions of the cervical spine and maxillofacial structures were also generated. A metallic BB was placed on the right temple in order to reliably differentiate right from left. COMPARISON:  CT  cervical spine 11/10/2017, 11/27/2008. CT head 08/22/2008 and earlier. FINDINGS: CT HEAD FINDINGS Brain: Ventricular system normal in size and appearance for age. No mass lesion. No midline shift. No acute hemorrhage or hematoma. No extra-axial fluid collections. No evidence of acute infarction. No focal brain parenchymal abnormalities. Vascular: Minimal BILATERAL vertebral artery atherosclerosis. No hyperdense vessel. Skull: No skull fracture or other focal osseous abnormality involving the skull. Other: None. CT MAXILLOFACIAL FINDINGS Osseous: No fractures identified involving the facial bones. Slight bony nasal septal deviation to the LEFT. Orbits: Normal in appearance. Sinuses: Paranasal sinuses, bilateral mastoid air cells and bilateral middle ear cavities well-aerated. Soft tissues: Normal in appearance. CT CERVICAL SPINE FINDINGS Alignment: Straightening of the usual lordosis. Minimal degenerative grade 1 spondylolisthesis of C5 on C6 measuring 3 mm. Skull base and vertebrae: No fractures identified involving the cervical spine. Facet joints anatomically aligned throughout with severe diffuse degenerative changes. Coronal reformatted images demonstrate an intact craniocervical junction, intact dens and intact lateral masses throughout. Degenerative changes at the C1-C2 articulation. Soft tissues and spinal canal: No evidence of paraspinous or spinal canal hematoma. No evidence of spinal stenosis. Disc levels: Disc spaces well-preserved throughout the cervical spine. No visible significant disc protrusions. Upper chest: Visualized lung apices clear. Visualized superior mediastinum normal. Other: None. IMPRESSION: 1. No acute intracranial abnormality. 2. No facial bone fractures identified. 3. No cervical spine fractures identified. 4. Severe facet degenerative changes involving the cervical spine as detailed above. Electronically Signed   By: Marcello Moores  Lawrence M.D.   On: 09/17/2018 11:06   Ct Cervical Spine  Wo Contrast  Result Date: 09/17/2018 CLINICAL DATA:  57 year old who awakened this morning soap with sweat from overnight. When she went to her bathroom at home and bent over her bathtub, she had a syncopal episode, striking her head on the bathtub and floor. Patient unsure of how long she lost consciousness. Bloody, swollen bottom lip and abrasion under the RIGHT eye. Initial encounter. EXAM: CT HEAD WITHOUT CONTRAST CT MAXILLOFACIAL WITHOUT CONTRAST CT CERVICAL SPINE WITHOUT CONTRAST TECHNIQUE: Multidetector CT imaging of the head, cervical spine, and maxillofacial structures were performed using the standard protocol without intravenous contrast. Multiplanar CT image reconstructions of the cervical spine and maxillofacial structures were also generated. A metallic BB was placed on the right temple in order to reliably differentiate right from left. COMPARISON:  CT cervical spine 11/10/2017, 11/27/2008. CT head 08/22/2008 and earlier. FINDINGS: CT HEAD FINDINGS Brain: Ventricular system normal in size and appearance for age. No mass lesion. No midline shift. No acute hemorrhage or hematoma. No extra-axial fluid collections. No evidence of acute infarction. No focal brain parenchymal abnormalities. Vascular: Minimal BILATERAL vertebral artery atherosclerosis. No hyperdense vessel. Skull: No skull fracture or other focal osseous abnormality involving the skull. Other: None. CT MAXILLOFACIAL FINDINGS Osseous: No fractures identified involving the facial bones. Slight bony nasal septal deviation to the LEFT. Orbits: Normal in appearance. Sinuses: Paranasal sinuses, bilateral mastoid air cells and bilateral middle ear cavities well-aerated. Soft tissues: Normal in appearance. CT CERVICAL SPINE FINDINGS Alignment: Straightening of the usual lordosis. Minimal degenerative grade 1 spondylolisthesis of C5 on C6 measuring 3 mm. Skull base and vertebrae: No fractures identified involving the cervical spine. Facet joints  anatomically aligned throughout with severe diffuse degenerative changes. Coronal reformatted images demonstrate an intact craniocervical junction, intact dens and intact lateral masses throughout. Degenerative changes at the C1-C2 articulation. Soft tissues and spinal canal: No evidence of paraspinous or spinal canal hematoma. No evidence of spinal stenosis. Disc levels: Disc spaces well-preserved throughout the cervical spine. No visible significant disc protrusions. Upper chest: Visualized lung apices clear. Visualized superior mediastinum normal. Other: None. IMPRESSION: 1. No acute intracranial abnormality. 2. No facial bone fractures identified. 3. No cervical spine fractures identified. 4. Severe facet degenerative changes involving the cervical spine as detailed above. Electronically Signed   By: Evangeline Dakin M.D.   On: 09/17/2018 11:06   Dg Chest Port 1 View  Result Date: 09/17/2018 CLINICAL DATA:  Sweating, syncopal episode EXAM: PORTABLE CHEST 1 VIEW COMPARISON:  Chest x-ray of 04/14/2014 FINDINGS: No active infiltrate or effusion is seen. Mediastinal and hilar contours are unremarkable. The heart is within upper limits of normal in size. No bony abnormality is seen. IMPRESSION: No active disease. Electronically Signed   By: Ivar Drape M.D.   On: 09/17/2018 10:45   Ct Maxillofacial Wo Cm  Result Date: 09/17/2018 CLINICAL DATA:  57 year old who awakened this morning soap with sweat from overnight. When she went to her bathroom at home and bent over her bathtub, she had a syncopal episode, striking her head on the bathtub and floor. Patient unsure of how long she lost consciousness. Bloody, swollen bottom lip and abrasion under the RIGHT eye. Initial encounter. EXAM: CT HEAD WITHOUT CONTRAST CT MAXILLOFACIAL WITHOUT CONTRAST CT CERVICAL SPINE WITHOUT CONTRAST TECHNIQUE: Multidetector CT imaging of the head, cervical spine, and maxillofacial structures were performed using the standard  protocol without intravenous contrast. Multiplanar CT image reconstructions of the cervical spine  and maxillofacial structures were also generated. A metallic BB was placed on the right temple in order to reliably differentiate right from left. COMPARISON:  CT cervical spine 11/10/2017, 11/27/2008. CT head 08/22/2008 and earlier. FINDINGS: CT HEAD FINDINGS Brain: Ventricular system normal in size and appearance for age. No mass lesion. No midline shift. No acute hemorrhage or hematoma. No extra-axial fluid collections. No evidence of acute infarction. No focal brain parenchymal abnormalities. Vascular: Minimal BILATERAL vertebral artery atherosclerosis. No hyperdense vessel. Skull: No skull fracture or other focal osseous abnormality involving the skull. Other: None. CT MAXILLOFACIAL FINDINGS Osseous: No fractures identified involving the facial bones. Slight bony nasal septal deviation to the LEFT. Orbits: Normal in appearance. Sinuses: Paranasal sinuses, bilateral mastoid air cells and bilateral middle ear cavities well-aerated. Soft tissues: Normal in appearance. CT CERVICAL SPINE FINDINGS Alignment: Straightening of the usual lordosis. Minimal degenerative grade 1 spondylolisthesis of C5 on C6 measuring 3 mm. Skull base and vertebrae: No fractures identified involving the cervical spine. Facet joints anatomically aligned throughout with severe diffuse degenerative changes. Coronal reformatted images demonstrate an intact craniocervical junction, intact dens and intact lateral masses throughout. Degenerative changes at the C1-C2 articulation. Soft tissues and spinal canal: No evidence of paraspinous or spinal canal hematoma. No evidence of spinal stenosis. Disc levels: Disc spaces well-preserved throughout the cervical spine. No visible significant disc protrusions. Upper chest: Visualized lung apices clear. Visualized superior mediastinum normal. Other: None. IMPRESSION: 1. No acute intracranial abnormality. 2.  No facial bone fractures identified. 3. No cervical spine fractures identified. 4. Severe facet degenerative changes involving the cervical spine as detailed above. Electronically Signed   By: Evangeline Dakin M.D.   On: 09/17/2018 11:06    EKG:   Orders placed or performed during the hospital encounter of 09/17/18  . ED EKG  . ED EKG  . EKG 12-Lead  . EKG 12-Lead   EKG shows normal sinus rhythm with 51 bpm, no ST-T changes. IMPRESSION AND PLAN:  57 year old female patient with depression, heavy tobacco abuse comes in because of syncope, flulike symptoms for the last 2 days found to have hypoxia with O2 sat were 69% on room air, hypotension initially. 1.  Syncope likely secondary to hypokalemia, hypoxia, hypotension:, Type influenza patient CT head unremarkable, patient received 2 L of fluid, watch her on off unit telemetry, check orthostatic vitals, give IV fluids and monitor.  #2, influenza type B: Start Tamiflu for 5 days. 3.  Acute respiratory failure with hypoxia likely secondary to influenza: Continue oxygen, she is on 2 L of oxygen when I saw her, sats were like 95%.  She is not in distress. 4.  Hypokalemia: Replace the potassium 5.  Depression: Continue her Cymbalta. 6.  Syncope: Patient had CT head, CT neck which are essentially within normal range.  Except some degeneration in the neck. 7 heavy tobacco abuse, patient smokes about 1/2 to 2 packs of cigarettes a day advised to quit smoking, offered assistance, counseling done for about 15 minutes, start nicotine patch.  Patient did not smoke since Saturday as she was not feeling well and she wants to quit.   All the records are reviewed and case discussed with ED provider. Management plans discussed with the patient, family and they are in agreement.  CODE STATUS: full  TOTAL TIME TAKING CARE OF THIS PATIENT:55 minutes.    Epifanio Lesches M.D on 09/17/2018 at 4:05 PM  Between 7am to 6pm - Pager - [phone]  After  6pm go to [email or web address] -  password [email or web address] [facility]  [facility]  Office  [phone]  CC: Primary care physician; [facility]  Note: This dictation was prepared with Dragon dictation along with smaller phrase technology. Any transcriptional errors that result from this process are unintentional.

## 2018-09-17 NOTE — ED Notes (Signed)
Pt placed back on nasal cannula on 2L of O2.

## 2018-09-17 NOTE — Discharge Instructions (Addendum)
You are evaluated after passing out episode and no serious injury is suspected.  You may take over-the-counter Tylenol or Iromin as needed for swelling and inflammation and pain.  Your blood pressure was found to be low and I suspect this is the cause of the passing out episode this morning, likely due to dehydration from nausea and vomiting over the past several days.  You are found to have influenza, and as we discussed because of the duration of your symptoms at this point time, no additional medications are likely be helpful.  Make sure you are staying well-hydrated.  Return to the emergency department immediately for any worsening condition including fever, trouble breathing, abdominal pain, concern for dehydration such as not making urine or dry mouth, dizziness or passing out, or any other symptoms concerning to you.

## 2018-09-17 NOTE — ED Notes (Addendum)
Upon discharge, pt taken off oxygen, pt desat to 85%.  Pt placed back on 2L nasal cannula.  EDP called to bedside.

## 2018-09-18 ENCOUNTER — Other Ambulatory Visit: Payer: Self-pay

## 2018-09-18 ENCOUNTER — Ambulatory Visit: Payer: Self-pay | Attending: Oncology

## 2018-09-18 LAB — MAGNESIUM: Magnesium: 1.7 mg/dL (ref 1.7–2.4)

## 2018-09-18 LAB — CBC
HCT: 34.3 % — ABNORMAL LOW (ref 36.0–46.0)
Hemoglobin: 10.8 g/dL — ABNORMAL LOW (ref 12.0–15.0)
MCH: 27.8 pg (ref 26.0–34.0)
MCHC: 31.5 g/dL (ref 30.0–36.0)
MCV: 88.4 fL (ref 80.0–100.0)
Platelets: 112 10*3/uL — ABNORMAL LOW (ref 150–400)
RBC: 3.88 MIL/uL (ref 3.87–5.11)
RDW: 14.3 % (ref 11.5–15.5)
WBC: 3.9 10*3/uL — ABNORMAL LOW (ref 4.0–10.5)
nRBC: 0 % (ref 0.0–0.2)

## 2018-09-18 LAB — BASIC METABOLIC PANEL
ANION GAP: 4 — AB (ref 5–15)
BUN: 11 mg/dL (ref 6–20)
CO2: 22 mmol/L (ref 22–32)
CREATININE: 0.78 mg/dL (ref 0.44–1.00)
Calcium: 7.4 mg/dL — ABNORMAL LOW (ref 8.9–10.3)
Chloride: 115 mmol/L — ABNORMAL HIGH (ref 98–111)
GFR calc Af Amer: >60 mL/min (ref >60)
GFR calc non Af Amer: >60 mL/min (ref >60)
Glucose, Bld: 105 mg/dL — ABNORMAL HIGH (ref 70–99)
Potassium: 4.5 mmol/L (ref 3.5–5.1)
Sodium: 141 mmol/L (ref 135–145)

## 2018-09-18 LAB — PHOSPHORUS: Phosphorus: 2.5 mg/dL (ref 2.5–4.6)

## 2018-09-18 LAB — GLUCOSE, CAPILLARY: GLUCOSE-CAPILLARY: 110 mg/dL — AB (ref 70–99)

## 2018-09-18 MED ORDER — OSELTAMIVIR PHOSPHATE 75 MG PO CAPS: 75.0000 mg | ORAL_CAPSULE | Freq: Two times a day (BID) | ORAL | 0 refills | Status: AC

## 2018-09-18 MED ORDER — INFLUENZA VAC SPLIT QUAD 0.5 ML IM SUSY: 0.5000 mL | PREFILLED_SYRINGE | INTRAMUSCULAR | Status: DC

## 2018-09-18 MED ORDER — PNEUMOCOCCAL VAC POLYVALENT 25 MCG/0.5ML IJ INJ: 0.5000 mL | INJECTION | INTRAMUSCULAR | Status: DC

## 2018-09-18 NOTE — Progress Notes (Signed)
Marca Ancona Carden Barrett  A and O x 4. VSS. Pt tolerating diet well. No complaints of pain or nausea. IV removed intact, prescriptions given. Pt voiced understanding of discharge instructions with no further questions. Pt discharged via wheelchair with axillary.    Allergies as of 09/18/2018      Reactions   Vicodin [hydrocodone-acetaminophen]    itching      Medication List    TAKE these medications   busPIRone 5 MG tablet Commonly known as:  BUSPAR Take 5 mg by mouth daily.   cyclobenzaprine 5 MG tablet Commonly known as:  FLEXERIL Take 1-2 tablets 3 times daily as needed   DULoxetine 60 MG capsule Commonly known as:  CYMBALTA Take 60 mg by mouth daily.   ibuprofen 800 MG tablet Commonly known as:  ADVIL,MOTRIN Take 1 tablet (800 mg total) by mouth every 8 (eight) hours as needed.   meclizine 25 MG tablet Commonly known as:  ANTIVERT Take 25 mg by mouth every 6 (six) hours as needed.   oseltamivir 75 MG capsule Commonly known as:  TAMIFLU Take 1 capsule (75 mg total) by mouth 2 (two) times daily for 4 days.       Vitals:   09/18/18 0805 09/18/18 1020  BP:    Pulse:    Resp:    Temp:    SpO2: 97% 94%    Francesco Sor

## 2018-09-18 NOTE — Progress Notes (Signed)
Pt ambulated 100 feet in room air, her oxygen was 94 % while ambulating.

## 2018-09-18 NOTE — Progress Notes (Signed)
Nutrition Brief Note  Patient identified on the Malnutrition Screening Tool (MST) Report  57 year old female patient with depression, heavy tobacco abuse admitted with type A flu and hypotension   Met with pt in room today. Pt reports good appetite and oral intake today; pt eating 50-100% of meals in hospital. Per chart, pt is weight stable pta.   Wt Readings from Last 15 Encounters:  09/17/18 90.8 kg  11/10/17 88.5 kg  12/22/15 89.7 kg    Body mass index is 32.31 kg/m. Patient meets criteria for obesity based on current BMI.   Current diet order is regular, patient is consuming approximately 50-100% of meals at this time. Labs and medications reviewed.   No nutrition interventions warranted at this time. If nutrition issues arise, please consult RD.   Pt to discharge today   Koleen Distance MS, RD, [place] Pager #[phone] Office#- [phone] After Hours Pager: [phone]

## 2018-09-18 NOTE — Progress Notes (Addendum)
Notified MD pt is having bigeminy rhythm in her telemetry monitor. Per MD okay for RN to order EKG.

## 2018-09-18 NOTE — Discharge Summary (Signed)
Catherine Pollard at [place] NAME: Catherine Pollard    MR#:  [id number]  DATE OF BIRTH:  1960-11-22  DATE OF ADMISSION:  09/17/2018 ADMITTING PHYSICIAN: Epifanio Lesches, MD  DATE OF DISCHARGE: 09/18/2018  PRIMARY CARE PHYSICIAN: [facility] [place]    ADMISSION DIAGNOSIS:  Bronchospasm [J98.01] Influenza A [J10.1] Hypoxia [R09.02] Facial contusion, initial encounter [S00.83XA] Laceration of oral cavity, initial encounter [S01.512A] Syncope, unspecified syncope type [R55]  DISCHARGE DIAGNOSIS:  Influenza A syncope due to clinical dehydration hypoxia resolved SECONDARY DIAGNOSIS:   Past Medical History:  Diagnosis Date  . Anxiety   . Vertigo     HOSPITAL COURSE:  Catherine Pollard  is a 57 y.o. female with a known history depression, heavy tobacco abuse comes in because of syncope.  Patient got up this morning and felt lightheaded and then passed out in the bathroom striking her head against concrete floor.  Has been having cough, diarrhea and low-grade temperature since Saturday.  1. syncope suspected due to clinical dehydration -patient has been having cough, some diarrhea and feeling lightheaded before she came had a syncopal episode. -Feels a lot better after IV fluids. Walking around. Sinus rhythm with PVCs on telemetry -hemodynamically appears to be stable. No symptoms of diarrhea or lightheadedness  2. Pancytopenia suspected due to viral/flu -pt to f/u with primary care physician in a week to 10 days to ensure labs are stable  3. Tobacco abuse. Patient agrees with smoking cessation  4. DVT prophylaxis ambulation/S CDs  Patient is feeling a whole lot better. She is anxious to go home.  CONSULTS OBTAINED:    DRUG ALLERGIES:   Allergies  Allergen Reactions  . Vicodin [Hydrocodone-Acetaminophen]     itching    DISCHARGE MEDICATIONS:   Allergies as of 09/18/2018      Reactions   Vicodin  [hydrocodone-acetaminophen]    itching      Medication List    TAKE these medications   busPIRone 5 MG tablet Commonly known as:  BUSPAR Take 5 mg by mouth daily.   cyclobenzaprine 5 MG tablet Commonly known as:  FLEXERIL Take 1-2 tablets 3 times daily as needed   DULoxetine 60 MG capsule Commonly known as:  CYMBALTA Take 60 mg by mouth daily.   ibuprofen 800 MG tablet Commonly known as:  ADVIL,MOTRIN Take 1 tablet (800 mg total) by mouth every 8 (eight) hours as needed.   meclizine 25 MG tablet Commonly known as:  ANTIVERT Take 25 mg by mouth every 6 (six) hours as needed.   oseltamivir 75 MG capsule Commonly known as:  TAMIFLU Take 1 capsule (75 mg total) by mouth 2 (two) times daily for 4 days.       If you experience worsening of your admission symptoms, develop shortness of breath, life threatening emergency, suicidal or homicidal thoughts you must seek medical attention immediately by calling 911 or calling your MD immediately  if symptoms less severe.  You Must read complete instructions/literature along with all the possible adverse reactions/side effects for all the Medicines you take and that have been prescribed to you. Take any new Medicines after you have completely understood and accept all the possible adverse reactions/side effects.   Please note  You were cared for by a hospitalist during your hospital stay. If you have any questions about your discharge medications or the care you received while you were in the hospital after you are discharged, you can call the unit  and asked to speak with the hospitalist on call if the hospitalist that took care of you is not available. Once you are discharged, your primary care physician will handle any further medical issues. Please note that NO REFILLS for any discharge medications will be authorized once you are discharged, as it is imperative that you return to your primary care physician (or establish a relationship  with a primary care physician if you do not have one) for your aftercare needs so that they can reassess your need for medications and monitor your lab values. Today   SUBJECTIVE   Doing well. Patient asking cannot go home?  VITAL SIGNS:  Blood pressure 103/65, pulse 73, temperature 98.5 F (36.9 C), temperature source Oral, resp. rate 18, height 5\' 6"  (1.676 m), weight 90.8 kg, SpO2 94 %.  I/O:    Intake/Output Summary (Last 24 hours) at 09/18/2018 1037 Last data filed at 09/18/2018 0835 Gross per 24 hour  Intake 2856 ml  Output 900 ml  Net 1956 ml    PHYSICAL EXAMINATION:  GENERAL:  57 y.o.-year-old patient lying in the bed with no acute distress.  EYES: Pupils equal, round, reactive to light and accommodation. No scleral icterus. Extraocular muscles intact.  HEENT: Head atraumatic, normocephalic. Oropharynx and nasopharynx clear.  NECK:  Supple, no jugular venous distention. No thyroid enlargement, no tenderness.  LUNGS: Normal breath sounds bilaterally, no wheezing, rales,rhonchi or crepitation. No use of accessory muscles of respiration.  CARDIOVASCULAR: S1, S2 normal. No murmurs, rubs, or gallops.  ABDOMEN: Soft, non-tender, non-distended. Bowel sounds present. No organomegaly or mass.  EXTREMITIES: No pedal edema, cyanosis, or clubbing.  NEUROLOGIC: Cranial nerves II through XII are intact. Muscle strength 5/5 in all extremities. Sensation intact. Gait not checked.  PSYCHIATRIC: The patient is alert and oriented x 3.  SKIN: No obvious rash, lesion, or ulcer.   DATA REVIEW:   CBC  Recent Labs  Lab 09/18/18 0437  WBC 3.9*  HGB 10.8*  HCT 34.3*  PLT 112*    Chemistries  Recent Labs  Lab 09/18/18 0437  NA 141  K 4.5  CL 115*  CO2 22  GLUCOSE 105*  BUN 11  CREATININE 0.78  CALCIUM 7.4*  MG 1.7    Microbiology Results   No results found for this or any previous visit (from the past 240 hour(s)).  RADIOLOGY:  Ct Head Wo Contrast  Result Date:  09/17/2018 CLINICAL DATA:  57 year old who awakened this morning soap with sweat from overnight. When she went to her bathroom at home and bent over her bathtub, she had a syncopal episode, striking her head on the bathtub and floor. Patient unsure of how long she lost consciousness. Bloody, swollen bottom lip and abrasion under the RIGHT eye. Initial encounter. EXAM: CT HEAD WITHOUT CONTRAST CT MAXILLOFACIAL WITHOUT CONTRAST CT CERVICAL SPINE WITHOUT CONTRAST TECHNIQUE: Multidetector CT imaging of the head, cervical spine, and maxillofacial structures were performed using the standard protocol without intravenous contrast. Multiplanar CT image reconstructions of the cervical spine and maxillofacial structures were also generated. A metallic BB was placed on the right temple in order to reliably differentiate right from left. COMPARISON:  CT cervical spine 11/10/2017, 11/27/2008. CT head 08/22/2008 and earlier. FINDINGS: CT HEAD FINDINGS Brain: Ventricular system normal in size and appearance for age. No mass lesion. No midline shift. No acute hemorrhage or hematoma. No extra-axial fluid collections. No evidence of acute infarction. No focal brain parenchymal abnormalities. Vascular: Minimal BILATERAL vertebral artery atherosclerosis. No hyperdense vessel.  Skull: No skull fracture or other focal osseous abnormality involving the skull. Other: None. CT MAXILLOFACIAL FINDINGS Osseous: No fractures identified involving the facial bones. Slight bony nasal septal deviation to the LEFT. Orbits: Normal in appearance. Sinuses: Paranasal sinuses, bilateral mastoid air cells and bilateral middle ear cavities well-aerated. Soft tissues: Normal in appearance. CT CERVICAL SPINE FINDINGS Alignment: Straightening of the usual lordosis. Minimal degenerative grade 1 spondylolisthesis of C5 on C6 measuring 3 mm. Skull base and vertebrae: No fractures identified involving the cervical spine. Facet joints anatomically aligned  throughout with severe diffuse degenerative changes. Coronal reformatted images demonstrate an intact craniocervical junction, intact dens and intact lateral masses throughout. Degenerative changes at the C1-C2 articulation. Soft tissues and spinal canal: No evidence of paraspinous or spinal canal hematoma. No evidence of spinal stenosis. Disc levels: Disc spaces well-preserved throughout the cervical spine. No visible significant disc protrusions. Upper chest: Visualized lung apices clear. Visualized superior mediastinum normal. Other: None. IMPRESSION: 1. No acute intracranial abnormality. 2. No facial bone fractures identified. 3. No cervical spine fractures identified. 4. Severe facet degenerative changes involving the cervical spine as detailed above. Electronically Signed   By: Evangeline Dakin M.D.   On: 09/17/2018 11:06   Ct Cervical Spine Wo Contrast  Result Date: 09/17/2018 CLINICAL DATA:  57 year old who awakened this morning soap with sweat from overnight. When she went to her bathroom at home and bent over her bathtub, she had a syncopal episode, striking her head on the bathtub and floor. Patient unsure of how long she lost consciousness. Bloody, swollen bottom lip and abrasion under the RIGHT eye. Initial encounter. EXAM: CT HEAD WITHOUT CONTRAST CT MAXILLOFACIAL WITHOUT CONTRAST CT CERVICAL SPINE WITHOUT CONTRAST TECHNIQUE: Multidetector CT imaging of the head, cervical spine, and maxillofacial structures were performed using the standard protocol without intravenous contrast. Multiplanar CT image reconstructions of the cervical spine and maxillofacial structures were also generated. A metallic BB was placed on the right temple in order to reliably differentiate right from left. COMPARISON:  CT cervical spine 11/10/2017, 11/27/2008. CT head 08/22/2008 and earlier. FINDINGS: CT HEAD FINDINGS Brain: Ventricular system normal in size and appearance for age. No mass lesion. No midline shift. No  acute hemorrhage or hematoma. No extra-axial fluid collections. No evidence of acute infarction. No focal brain parenchymal abnormalities. Vascular: Minimal BILATERAL vertebral artery atherosclerosis. No hyperdense vessel. Skull: No skull fracture or other focal osseous abnormality involving the skull. Other: None. CT MAXILLOFACIAL FINDINGS Osseous: No fractures identified involving the facial bones. Slight bony nasal septal deviation to the LEFT. Orbits: Normal in appearance. Sinuses: Paranasal sinuses, bilateral mastoid air cells and bilateral middle ear cavities well-aerated. Soft tissues: Normal in appearance. CT CERVICAL SPINE FINDINGS Alignment: Straightening of the usual lordosis. Minimal degenerative grade 1 spondylolisthesis of C5 on C6 measuring 3 mm. Skull base and vertebrae: No fractures identified involving the cervical spine. Facet joints anatomically aligned throughout with severe diffuse degenerative changes. Coronal reformatted images demonstrate an intact craniocervical junction, intact dens and intact lateral masses throughout. Degenerative changes at the C1-C2 articulation. Soft tissues and spinal canal: No evidence of paraspinous or spinal canal hematoma. No evidence of spinal stenosis. Disc levels: Disc spaces well-preserved throughout the cervical spine. No visible significant disc protrusions. Upper chest: Visualized lung apices clear. Visualized superior mediastinum normal. Other: None. IMPRESSION: 1. No acute intracranial abnormality. 2. No facial bone fractures identified. 3. No cervical spine fractures identified. 4. Severe facet degenerative changes involving the cervical spine as detailed above. Electronically Signed  By: Evangeline Dakin M.D.   On: 09/17/2018 11:06   Dg Chest Port 1 View  Result Date: 09/17/2018 CLINICAL DATA:  Sweating, syncopal episode EXAM: PORTABLE CHEST 1 VIEW COMPARISON:  Chest x-ray of 04/14/2014 FINDINGS: No active infiltrate or effusion is seen.  Mediastinal and hilar contours are unremarkable. The heart is within upper limits of normal in size. No bony abnormality is seen. IMPRESSION: No active disease. Electronically Signed   By: Ivar Drape M.D.   On: 09/17/2018 10:45   Ct Maxillofacial Wo Cm  Result Date: 09/17/2018 CLINICAL DATA:  57 year old who awakened this morning soap with sweat from overnight. When she went to her bathroom at home and bent over her bathtub, she had a syncopal episode, striking her head on the bathtub and floor. Patient unsure of how long she lost consciousness. Bloody, swollen bottom lip and abrasion under the RIGHT eye. Initial encounter. EXAM: CT HEAD WITHOUT CONTRAST CT MAXILLOFACIAL WITHOUT CONTRAST CT CERVICAL SPINE WITHOUT CONTRAST TECHNIQUE: Multidetector CT imaging of the head, cervical spine, and maxillofacial structures were performed using the standard protocol without intravenous contrast. Multiplanar CT image reconstructions of the cervical spine and maxillofacial structures were also generated. A metallic BB was placed on the right temple in order to reliably differentiate right from left. COMPARISON:  CT cervical spine 11/10/2017, 11/27/2008. CT head 08/22/2008 and earlier. FINDINGS: CT HEAD FINDINGS Brain: Ventricular system normal in size and appearance for age. No mass lesion. No midline shift. No acute hemorrhage or hematoma. No extra-axial fluid collections. No evidence of acute infarction. No focal brain parenchymal abnormalities. Vascular: Minimal BILATERAL vertebral artery atherosclerosis. No hyperdense vessel. Skull: No skull fracture or other focal osseous abnormality involving the skull. Other: None. CT MAXILLOFACIAL FINDINGS Osseous: No fractures identified involving the facial bones. Slight bony nasal septal deviation to the LEFT. Orbits: Normal in appearance. Sinuses: Paranasal sinuses, bilateral mastoid air cells and bilateral middle ear cavities well-aerated. Soft tissues: Normal in appearance.  CT CERVICAL SPINE FINDINGS Alignment: Straightening of the usual lordosis. Minimal degenerative grade 1 spondylolisthesis of C5 on C6 measuring 3 mm. Skull base and vertebrae: No fractures identified involving the cervical spine. Facet joints anatomically aligned throughout with severe diffuse degenerative changes. Coronal reformatted images demonstrate an intact craniocervical junction, intact dens and intact lateral masses throughout. Degenerative changes at the C1-C2 articulation. Soft tissues and spinal canal: No evidence of paraspinous or spinal canal hematoma. No evidence of spinal stenosis. Disc levels: Disc spaces well-preserved throughout the cervical spine. No visible significant disc protrusions. Upper chest: Visualized lung apices clear. Visualized superior mediastinum normal. Other: None. IMPRESSION: 1. No acute intracranial abnormality. 2. No facial bone fractures identified. 3. No cervical spine fractures identified. 4. Severe facet degenerative changes involving the cervical spine as detailed above. Electronically Signed   By: Evangeline Dakin M.D.   On: 09/17/2018 11:06     Management plans discussed with the patient, family and they are in agreement.  CODE STATUS:     Code Status Orders  (From admission, onward)         Start     Ordered   09/17/18 1557  Full code  Continuous     09/17/18 1557        Code Status History    This patient has a current code status but no historical code status.      TOTAL TIME TAKING CARE OF THIS PATIENT: 40 minutes.    Fritzi Mandes M.D on 09/18/2018 at 10:37 AM  Between  7am to 6pm - Pager - [phone] After 6pm go to [email or web address] - password [email or web address] [facility]  Office  [phone]  CC: Primary care physician; [facility]

## 2018-09-19 LAB — HIV ANTIBODY (ROUTINE TESTING W REFLEX): HIV Screen 4th Generation wRfx: NONREACTIVE

## 2020-11-22 ENCOUNTER — Emergency Department: Payer: HRSA Program

## 2020-11-22 ENCOUNTER — Inpatient Hospital Stay
Admission: EM | Admit: 2020-11-22 | Discharge: 2020-11-23 | DRG: 177 | Disposition: A | Discharge status: Home / Self Care | Payer: HRSA Program | Attending: Internal Medicine | Admitting: Internal Medicine

## 2020-11-22 ENCOUNTER — Other Ambulatory Visit: Payer: Self-pay

## 2020-11-22 ENCOUNTER — Encounter: Payer: Self-pay | Admitting: Emergency Medicine

## 2020-11-22 DIAGNOSIS — R5381 Other malaise: Secondary | ICD-10-CM | POA: Diagnosis present

## 2020-11-22 DIAGNOSIS — K8020 Calculus of gallbladder without cholecystitis without obstruction: Secondary | ICD-10-CM | POA: Diagnosis present

## 2020-11-22 DIAGNOSIS — D72828 Other elevated white blood cell count: Secondary | ICD-10-CM | POA: Diagnosis present

## 2020-11-22 DIAGNOSIS — I2694 Multiple subsegmental pulmonary emboli without acute cor pulmonale: Secondary | ICD-10-CM | POA: Diagnosis present

## 2020-11-22 DIAGNOSIS — U071 COVID-19: Principal | ICD-10-CM | POA: Diagnosis present

## 2020-11-22 DIAGNOSIS — M546 Pain in thoracic spine: Secondary | ICD-10-CM

## 2020-11-22 DIAGNOSIS — R0602 Shortness of breath: Secondary | ICD-10-CM | POA: Diagnosis not present

## 2020-11-22 DIAGNOSIS — R1011 Right upper quadrant pain: Secondary | ICD-10-CM

## 2020-11-22 DIAGNOSIS — Z90710 Acquired absence of both cervix and uterus: Secondary | ICD-10-CM

## 2020-11-22 DIAGNOSIS — J45909 Unspecified asthma, uncomplicated: Secondary | ICD-10-CM | POA: Diagnosis present

## 2020-11-22 DIAGNOSIS — F4310 Post-traumatic stress disorder, unspecified: Secondary | ICD-10-CM | POA: Diagnosis present

## 2020-11-22 DIAGNOSIS — Z79899 Other long term (current) drug therapy: Secondary | ICD-10-CM

## 2020-11-22 DIAGNOSIS — I2699 Other pulmonary embolism without acute cor pulmonale: Secondary | ICD-10-CM

## 2020-11-22 DIAGNOSIS — F17210 Nicotine dependence, cigarettes, uncomplicated: Secondary | ICD-10-CM | POA: Diagnosis present

## 2020-11-22 DIAGNOSIS — F319 Bipolar disorder, unspecified: Secondary | ICD-10-CM | POA: Diagnosis present

## 2020-11-22 HISTORY — DX: Post-traumatic stress disorder, unspecified: F43.10

## 2020-11-22 HISTORY — DX: Bipolar disorder, unspecified: F31.9

## 2020-11-22 LAB — URINALYSIS, COMPLETE (UACMP) WITH MICROSCOPIC
Bacteria, UA: NONE SEEN
Bilirubin Urine: NEGATIVE
Glucose, UA: NEGATIVE mg/dL
Ketones, ur: NEGATIVE mg/dL
Leukocytes,Ua: NEGATIVE
Nitrite: NEGATIVE
Protein, ur: NEGATIVE mg/dL
Specific Gravity, Urine: 1.034 — ABNORMAL HIGH (ref 1.005–1.030)
pH: 5 (ref 5.0–8.0)

## 2020-11-22 LAB — LIPASE, BLOOD: Lipase: 31 U/L (ref 11–51)

## 2020-11-22 LAB — CBC
HCT: 38.1 % (ref 36.0–46.0)
Hemoglobin: 12.7 g/dL (ref 12.0–15.0)
MCH: 27.5 pg (ref 26.0–34.0)
MCHC: 33.3 g/dL (ref 30.0–36.0)
MCV: 82.5 fL (ref 80.0–100.0)
Platelets: 163 10*3/uL (ref 150–400)
RBC: 4.62 MIL/uL (ref 3.87–5.11)
RDW: 14 % (ref 11.5–15.5)
WBC: 11 10*3/uL — ABNORMAL HIGH (ref 4.0–10.5)
nRBC: 0 % (ref 0.0–0.2)

## 2020-11-22 LAB — COMPREHENSIVE METABOLIC PANEL
ALT: 12 U/L (ref 0–44)
AST: 12 U/L — ABNORMAL LOW (ref 15–41)
Albumin: 3.7 g/dL (ref 3.5–5.0)
Alkaline Phosphatase: 73 U/L (ref 38–126)
Anion gap: 11 (ref 5–15)
BUN: 13 mg/dL (ref 6–20)
CO2: 22 mmol/L (ref 22–32)
Calcium: 8.8 mg/dL — ABNORMAL LOW (ref 8.9–10.3)
Chloride: 105 mmol/L (ref 98–111)
Creatinine, Ser: 0.78 mg/dL (ref 0.44–1.00)
GFR, Estimated: >60 mL/min (ref >60)
Glucose, Bld: 111 mg/dL — ABNORMAL HIGH (ref 70–99)
Potassium: 3.8 mmol/L (ref 3.5–5.1)
Sodium: 138 mmol/L (ref 135–145)
Total Bilirubin: 0.9 mg/dL (ref 0.3–1.2)
Total Protein: 7.6 g/dL (ref 6.5–8.1)

## 2020-11-22 LAB — TROPONIN I (HIGH SENSITIVITY)
Troponin I (High Sensitivity): <2 ng/L (ref <18)
Troponin I (High Sensitivity): 3 ng/L

## 2020-11-22 LAB — RESP PANEL BY RT-PCR (FLU A&B, COVID) ARPGX2
Influenza A by PCR: NEGATIVE
Influenza B by PCR: NEGATIVE
SARS Coronavirus 2 by RT PCR: POSITIVE — AB

## 2020-11-22 LAB — POC URINE PREG, ED: Preg Test, Ur: NEGATIVE

## 2020-11-22 MED ORDER — KETOROLAC TROMETHAMINE 30 MG/ML IJ SOLN
15.0000 mg | Freq: Once | INTRAMUSCULAR | Status: AC
  Administered 2020-11-22: 15 mg via INTRAVENOUS
  Filled 2020-11-22: qty 1

## 2020-11-22 MED ORDER — IOHEXOL 300 MG/ML  SOLN
100.0000 mL | Freq: Once | INTRAMUSCULAR | Status: AC | PRN
  Administered 2020-11-22: 100 mL via INTRAVENOUS

## 2020-11-22 NOTE — ED Provider Notes (Signed)
[facility] Emergency Department Provider Note   ____________________________________________   Event Date/Time   First MD Initiated Contact with Patient 11/22/20 2147     (approximate)  I have reviewed the triage vital signs and the nursing notes.   HISTORY  Chief Complaint Back Pain and Abdominal Pain    HPI Catherine Pollard is a 60 y.o. female with past medical history of bipolar disorder, PTSD, and vertigo who presents to the ED complaining of back and abdominal pain.  Patient reports that she has had gradually worsening pain in the right side of her mid and lower back for the past 24 hours.  Pain is waxing and waning in severity, seems to be exacerbated by movement.  She describes it as a stabbing that is severe at times and wraps around from her right flank to the right upper quadrant of her abdomen.  She denies any nausea or vomiting and has not had any dysuria or hematuria.  She denies any fevers, cough, or chest pain, but does states that when the pain is severe it makes it hard for her to breathe.  She complains of generalized malaise, but denies any sick contacts.        Past Medical History:  Diagnosis Date  . Anxiety   . Bipolar disorder ([place])   . PTSD (post-traumatic stress disorder)   . Vertigo     Patient Active Problem List   Diagnosis Date Noted  . Syncope 09/17/2018    Past Surgical History:  Procedure Laterality Date  . ABDOMINAL HYSTERECTOMY    . FOOT FRACTURE SURGERY    . TONSILLECTOMY    . WRIST SURGERY      Prior to Admission medications   Medication Sig Start Date End Date Taking? Authorizing Provider  busPIRone (BUSPAR) 5 MG tablet Take 5 mg by mouth daily.    [provider]  cyclobenzaprine (FLEXERIL) 5 MG tablet Take 1-2 tablets 3 times daily as needed 11/10/17   Laban Emperor, PA-C  DULoxetine (CYMBALTA) 60 MG capsule Take 60 mg by mouth daily.    [provider]  ibuprofen  (ADVIL,MOTRIN) 800 MG tablet Take 1 tablet (800 mg total) by mouth every 8 (eight) hours as needed. 11/10/17   Laban Emperor, PA-C  meclizine (ANTIVERT) 25 MG tablet Take 25 mg by mouth every 6 (six) hours as needed.     [provider]    Allergies Vicodin [hydrocodone-acetaminophen]  History reviewed. No pertinent family history.  Social History Social History   Tobacco Use  . Smoking status: Current Every Day Smoker    Packs/day: 0.50    Types: Cigarettes  . Smokeless tobacco: Never Used  Substance Use Topics  . Alcohol use: Not Currently  . Drug use: No    Review of Systems  Constitutional: No fever/chills Eyes: No visual changes. ENT: No sore throat. Cardiovascular: Denies chest pain. Respiratory: Positive for shortness of breath. Gastrointestinal: Positive for flank and abdominal pain.  No nausea, no vomiting.  No diarrhea.  No constipation. Genitourinary: Negative for dysuria. Musculoskeletal: Positive for back pain. Skin: Negative for rash. Neurological: Negative for headaches, focal weakness or numbness.  ____________________________________________   PHYSICAL EXAM:  VITAL SIGNS: ED Triage Vitals  Enc Vitals Group     BP 11/22/20 1823 (!) 104/56     Pulse Rate 11/22/20 1823 94     Resp 11/22/20 1823 20     Temp 11/22/20 1823 98.3 F (36.8 C)  Temp Source 11/22/20 1823 Oral     SpO2 11/22/20 1823 93 %     Weight 11/22/20 1827 215 lb (97.5 kg)     Height 11/22/20 1827 5\' 5"  (1.651 m)     Head Circumference --      Peak Flow --      Pain Score 11/22/20 1824 7     Pain Loc --      Pain Edu? --      Excl. in [place]? --     Constitutional: Alert and oriented. Eyes: Conjunctivae are normal. Head: Atraumatic. Nose: No congestion/rhinnorhea. Mouth/Throat: Mucous membranes are moist. Neck: Normal ROM Cardiovascular: Normal rate, regular rhythm. Grossly normal heart sounds. Respiratory: Normal respiratory effort.  No retractions. Lungs  CTAB. Gastrointestinal: Soft and tender to palpation in the right upper quadrant with no rebound or guarding, right CVA tenderness noted. No distention. Genitourinary: deferred Musculoskeletal: No lower extremity tenderness nor edema.  No midline thoracic or lumbar spinal tenderness. Neurologic:  Normal speech and language. No gross focal neurologic deficits are appreciated. Skin:  Skin is warm, dry and intact. No rash noted. Psychiatric: Mood and affect are normal. Speech and behavior are normal.  ____________________________________________   LABS (all labs ordered are listed, but only abnormal results are displayed)  Labs Reviewed  COMPREHENSIVE METABOLIC PANEL - Abnormal; Notable for the following components:      Result Value   Glucose, Bld 111 (*)    Calcium 8.8 (*)    AST 12 (*)    All other components within normal limits  CBC - Abnormal; Notable for the following components:   WBC 11.0 (*)    All other components within normal limits  URINALYSIS, COMPLETE (UACMP) WITH MICROSCOPIC - Abnormal; Notable for the following components:   Color, Urine AMBER (*)    APPearance HAZY (*)    Specific Gravity, Urine 1.034 (*)    Hgb urine dipstick SMALL (*)    All other components within normal limits  RESP PANEL BY RT-PCR (FLU A&B, COVID) ARPGX2  LIPASE, BLOOD  POC URINE PREG, ED  TROPONIN I (HIGH SENSITIVITY)  TROPONIN I (HIGH SENSITIVITY)   ____________________________________________  EKG  ED ECG REPORT I, Blake Divine, the attending physician, personally viewed and interpreted this ECG.   Date: 11/22/2020  EKG Time: 18:18  Rate: 94  Rhythm: normal sinus rhythm  Axis: Normal  Intervals:none  ST&T Change: none   PROCEDURES  Procedure(s) performed (including Critical Care):  Procedures   ____________________________________________   INITIAL IMPRESSION / ASSESSMENT AND PLAN / ED COURSE       60 year old female with past medical history of bipolar  disorder, PTSD, and vertigo who presents to the ED with waxing and waning pain affecting her right mid back, flank, and right upper quadrant of her abdomen since yesterday.  On exam, her greatest area of pain seems to be in her right flank.  She denies any urinary symptoms and UA shows no evidence of infection but does show small amount of blood.  Labs are otherwise reassuring, chest x-ray reviewed by me and shows no infiltrate, edema, or effusion.  EKG shows no evidence of arrhythmia or ischemia and troponin is negative, I doubt ACS or PE at this time.  We will further assess for biliary pathology or kidney stone with CT scan, treat patient's pain with Toradol.  Patient turned over to oncoming provider pending CT results.  We will also check testing for COVID-19 given her generalized weakness and body aches.  ____________________________________________   FINAL CLINICAL IMPRESSION(S) / ED DIAGNOSES  Final diagnoses:  Right upper quadrant abdominal pain  Acute right-sided thoracic back pain     ED Discharge Orders    None       Note:  This document was prepared using Dragon voice recognition software and may include unintentional dictation errors.   Blake Divine, MD 11/22/20 [phone]

## 2020-11-22 NOTE — ED Provider Notes (Signed)
11:15 PM  Assumed care.  Patient is a 60 year old female who presents to the emergency department with malaise and myalgias that started yesterday.  Also having right upper quadrant and right flank pain.  Labs unremarkable other than minimal leukocytosis of 11,000.  Urine does not appear infected.  CT abdomen pelvis and Covid test pending for further evaluation.  12:40 AM  Pt has SOB.  Feels like someone is sitting on her chest.  She is COVID positive.  Has h/o asthma.  No other significant risk factors.  Not vaccinated for COVID 19.  She is not hypoxic at rest but sats did drop to 91% with ambulation in the room and she felt lightheaded like she might pass out she became tachycardic.  CT of abdomen pelvis shows bibasilar infiltrates and pulmonary infarct cannot be excluded.  Discussed with radiologist and they recommend CTA of the chest to rule out PE.  CT of the abdomen pelvis also shows gallstones without signs of cholecystitis or obstruction.  She also appears constipated without bowel obstruction.  Discussed these findings as well with patient.  Will p.o. challenge in the ED.  2:15 AM  D/w radiologist Dr. Joelyn Oms.  CTA shows bilateral pulmonary emboli with moderate clot burden on the right with near occlusive clot in the right lower lobe with associated pulmonary infarct.  No infiltrates to suggest Covid pneumonia.  Will start heparin.  Given significant clot burden, have recommended admission.  Will discuss with hospitalist.  2:19 AM Discussed patient's case with hospitalist, Dr. Sidney Ace.  I have recommended admission and patient (and family if present) agree with this plan. Admitting physician will place admission orders.   He recommends obtaining inflammatory markers and starting remdesivir.  Will hold on steroids at this time given no hypoxia.  She has no current oxygen requirement.  I reviewed all nursing notes, vitals, pertinent previous records and reviewed/interpreted all EKGs, lab and urine  results, imaging (as available).     CRITICAL CARE Performed by: Pryor Curia   Total critical care time: 45 minutes  Critical care time was exclusive of separately billable procedures and treating other patients.  Critical care was necessary to treat or prevent imminent or life-threatening deterioration.  Critical care was time spent personally by me on the following activities: development of treatment plan with patient and/or surrogate as well as nursing, discussions with consultants, evaluation of patient's response to treatment, examination of patient, obtaining history from patient or surrogate, ordering and performing treatments and interventions, ordering and review of laboratory studies, ordering and review of radiographic studies, pulse oximetry and re-evaluation of patient's condition.    Kennice Finnie, Delice Bison, DO 11/23/20 [phone]

## 2020-11-22 NOTE — ED Triage Notes (Signed)
First Nurse Note:  Arrives via [facility].  Arrives from home with c/o chest pain x 1 day.  Per report EKG wnl.  VS wnl.   AAOx3.  Skin warm and dry.  NAD  No SOB/ DOE.  Calm relaxed.

## 2020-11-22 NOTE — ED Triage Notes (Signed)
Pt c/o lower abdominal pain and lower back pain that started yesterday, pt c/o general malaise as well. Pt states last night felt like someone "sitting on my chest" when she was laying down". Pt states "it feels like someone beat me". Pt also c/o SOB at this time.

## 2020-11-23 ENCOUNTER — Other Ambulatory Visit: Payer: Self-pay | Admitting: Family Medicine

## 2020-11-23 ENCOUNTER — Emergency Department: Payer: HRSA Program

## 2020-11-23 DIAGNOSIS — R5381 Other malaise: Secondary | ICD-10-CM | POA: Diagnosis present

## 2020-11-23 DIAGNOSIS — I2699 Other pulmonary embolism without acute cor pulmonale: Secondary | ICD-10-CM | POA: Diagnosis present

## 2020-11-23 DIAGNOSIS — Z90710 Acquired absence of both cervix and uterus: Secondary | ICD-10-CM | POA: Diagnosis not present

## 2020-11-23 DIAGNOSIS — K8020 Calculus of gallbladder without cholecystitis without obstruction: Secondary | ICD-10-CM | POA: Diagnosis present

## 2020-11-23 DIAGNOSIS — I2694 Multiple subsegmental pulmonary emboli without acute cor pulmonale: Secondary | ICD-10-CM | POA: Diagnosis present

## 2020-11-23 DIAGNOSIS — F32A Depression, unspecified: Secondary | ICD-10-CM

## 2020-11-23 DIAGNOSIS — F419 Anxiety disorder, unspecified: Secondary | ICD-10-CM

## 2020-11-23 DIAGNOSIS — R0602 Shortness of breath: Secondary | ICD-10-CM | POA: Diagnosis present

## 2020-11-23 DIAGNOSIS — U071 COVID-19: Secondary | ICD-10-CM | POA: Diagnosis present

## 2020-11-23 DIAGNOSIS — Z79899 Other long term (current) drug therapy: Secondary | ICD-10-CM | POA: Diagnosis not present

## 2020-11-23 DIAGNOSIS — D72828 Other elevated white blood cell count: Secondary | ICD-10-CM | POA: Diagnosis present

## 2020-11-23 DIAGNOSIS — F4310 Post-traumatic stress disorder, unspecified: Secondary | ICD-10-CM | POA: Diagnosis present

## 2020-11-23 DIAGNOSIS — F17210 Nicotine dependence, cigarettes, uncomplicated: Secondary | ICD-10-CM | POA: Diagnosis present

## 2020-11-23 DIAGNOSIS — J45909 Unspecified asthma, uncomplicated: Secondary | ICD-10-CM | POA: Diagnosis present

## 2020-11-23 DIAGNOSIS — F319 Bipolar disorder, unspecified: Secondary | ICD-10-CM | POA: Diagnosis present

## 2020-11-23 LAB — LACTATE DEHYDROGENASE: LDH: 191 U/L (ref 98–192)

## 2020-11-23 LAB — HEPATIC FUNCTION PANEL
ALT: 14 U/L (ref 0–44)
AST: 15 U/L (ref 15–41)
Albumin: 3.6 g/dL (ref 3.5–5.0)
Alkaline Phosphatase: 78 U/L (ref 38–126)
Bilirubin, Direct: 0.2 mg/dL (ref 0.0–0.2)
Indirect Bilirubin: 0.9 mg/dL (ref 0.3–0.9)
Total Bilirubin: 1.1 mg/dL (ref 0.3–1.2)
Total Protein: 7.6 g/dL (ref 6.5–8.1)

## 2020-11-23 LAB — FIBRINOGEN: Fibrinogen: 705 mg/dL — ABNORMAL HIGH (ref 210–475)

## 2020-11-23 LAB — FIBRIN DERIVATIVES D-DIMER (ARMC ONLY): Fibrin derivatives D-dimer (ARMC): 4861.54 ng/mL (FEU) — ABNORMAL HIGH (ref 0.00–499.00)

## 2020-11-23 LAB — PROTIME-INR
INR: 1.1 (ref 0.8–1.2)
Prothrombin Time: 14.2 seconds (ref 11.4–15.2)

## 2020-11-23 LAB — FERRITIN: Ferritin: 124 ng/mL (ref 11–307)

## 2020-11-23 LAB — APTT: aPTT: 26 seconds (ref 24–36)

## 2020-11-23 LAB — PROCALCITONIN: Procalcitonin: 0.2 ng/mL

## 2020-11-23 LAB — HIV ANTIBODY (ROUTINE TESTING W REFLEX): HIV Screen 4th Generation wRfx: NONREACTIVE

## 2020-11-23 LAB — LACTIC ACID, PLASMA: Lactic Acid, Venous: 1.5 mmol/L (ref 0.5–1.9)

## 2020-11-23 LAB — TRIGLYCERIDES: Triglycerides: 49 mg/dL (ref <150)

## 2020-11-23 LAB — C-REACTIVE PROTEIN: CRP: 22.7 mg/dL — ABNORMAL HIGH (ref <1.0)

## 2020-11-23 MED ORDER — IOHEXOL 350 MG/ML SOLN
75.0000 mL | Freq: Once | INTRAVENOUS | Status: AC | PRN
  Administered 2020-11-23: 75 mL via INTRAVENOUS

## 2020-11-23 MED ORDER — GUAIFENESIN-DM 100-10 MG/5ML PO SYRP: 10.0000 mL | ORAL_SOLUTION | ORAL | Status: DC | PRN

## 2020-11-23 MED ORDER — DULOXETINE HCL 60 MG PO CPEP
60.0000 mg | ORAL_CAPSULE | Freq: Every day | ORAL | Status: DC
  Administered 2020-11-23: 60 mg via ORAL
  Filled 2020-11-23: qty 1

## 2020-11-23 MED ORDER — SODIUM CHLORIDE 0.9 % IV SOLN
200.0000 mg | Freq: Once | INTRAVENOUS | Status: AC
  Administered 2020-11-23: 200 mg via INTRAVENOUS
  Filled 2020-11-23: qty 200

## 2020-11-23 MED ORDER — ONDANSETRON HCL 4 MG/2ML IJ SOLN: 4.0000 mg | Freq: Four times a day (QID) | INTRAMUSCULAR | Status: DC | PRN

## 2020-11-23 MED ORDER — ASCORBIC ACID 500 MG PO TABS
500.0000 mg | ORAL_TABLET | Freq: Every day | ORAL | Status: DC
  Administered 2020-11-23: 500 mg via ORAL
  Filled 2020-11-23: qty 1

## 2020-11-23 MED ORDER — ZINC SULFATE 220 (50 ZN) MG PO CAPS
220.0000 mg | ORAL_CAPSULE | Freq: Every day | ORAL | Status: DC
  Administered 2020-11-23: 220 mg via ORAL
  Filled 2020-11-23: qty 1

## 2020-11-23 MED ORDER — SODIUM CHLORIDE 0.9 % IV SOLN: 200.0000 mg | Freq: Once | INTRAVENOUS | Status: DC

## 2020-11-23 MED ORDER — FAMOTIDINE 20 MG PO TABS
20.0000 mg | ORAL_TABLET | Freq: Two times a day (BID) | ORAL | Status: DC
  Administered 2020-11-23 (×2): 20 mg via ORAL
  Filled 2020-11-23 (×2): qty 1

## 2020-11-23 MED ORDER — SODIUM CHLORIDE 0.9 % IV SOLN: 100.0000 mg | Freq: Every day | INTRAVENOUS | Status: DC

## 2020-11-23 MED ORDER — HEPARIN BOLUS VIA INFUSION
4000.0000 [IU] | Freq: Once | INTRAVENOUS | Status: AC
  Administered 2020-11-23: 4000 [IU] via INTRAVENOUS
  Filled 2020-11-23: qty 4000

## 2020-11-23 MED ORDER — BUSPIRONE HCL 5 MG PO TABS
5.0000 mg | ORAL_TABLET | Freq: Every day | ORAL | Status: DC
  Administered 2020-11-23: 5 mg via ORAL
  Filled 2020-11-23: qty 1

## 2020-11-23 MED ORDER — METHYLPREDNISOLONE SODIUM SUCC 125 MG IJ SOLR
1.0000 mg/kg | Freq: Two times a day (BID) | INTRAMUSCULAR | Status: DC
  Administered 2020-11-23: 97.5 mg via INTRAVENOUS
  Filled 2020-11-23: qty 2

## 2020-11-23 MED ORDER — HYDROCODONE-ACETAMINOPHEN 5-325 MG PO TABS: 1.0000 | ORAL_TABLET | ORAL | 0 refills | Status: AC | PRN

## 2020-11-23 MED ORDER — ACETAMINOPHEN 325 MG PO TABS: 650.0000 mg | ORAL_TABLET | Freq: Four times a day (QID) | ORAL | Status: DC | PRN

## 2020-11-23 MED ORDER — MAGNESIUM HYDROXIDE 400 MG/5ML PO SUSP: 30.0000 mL | Freq: Every day | ORAL | Status: DC | PRN

## 2020-11-23 MED ORDER — HYDROCOD POLST-CPM POLST ER 10-8 MG/5ML PO SUER
5.0000 mL | Freq: Two times a day (BID) | ORAL | Status: DC | PRN
Start: 2020-11-23 — End: 2020-11-23

## 2020-11-23 MED ORDER — CYCLOBENZAPRINE HCL 10 MG PO TABS: 5.0000 mg | ORAL_TABLET | Freq: Three times a day (TID) | ORAL | Status: DC | PRN

## 2020-11-23 MED ORDER — ASPIRIN EC 81 MG PO TBEC
81.0000 mg | DELAYED_RELEASE_TABLET | Freq: Every day | ORAL | Status: DC
  Administered 2020-11-23: 81 mg via ORAL
  Filled 2020-11-23: qty 1

## 2020-11-23 MED ORDER — SODIUM CHLORIDE 0.9 % IV SOLN: INTRAVENOUS | Status: DC

## 2020-11-23 MED ORDER — HEPARIN (PORCINE) 25000 UT/250ML-% IV SOLN
950.0000 [IU]/h | INTRAVENOUS | Status: DC
  Administered 2020-11-23: 950 [IU]/h via INTRAVENOUS
  Filled 2020-11-23: qty 250

## 2020-11-23 MED ORDER — APIXABAN (ELIQUIS) VTE STARTER PACK (10MG AND 5MG): ORAL_TABLET | ORAL | 0 refills | Status: DC

## 2020-11-23 MED ORDER — TRAZODONE HCL 50 MG PO TABS: 25.0000 mg | ORAL_TABLET | Freq: Every evening | ORAL | Status: DC | PRN

## 2020-11-23 MED ORDER — MECLIZINE HCL 25 MG PO TABS: 12.5000 mg | ORAL_TABLET | Freq: Three times a day (TID) | ORAL | Status: DC | PRN

## 2020-11-23 MED ORDER — APIXABAN (ELIQUIS) EDUCATION KIT FOR DVT/PE PATIENTS
PACK | Freq: Once | Status: AC
  Filled 2020-11-23: qty 1

## 2020-11-23 MED ORDER — PREDNISONE 20 MG PO TABS: 50.0000 mg | ORAL_TABLET | Freq: Every day | ORAL | Status: DC

## 2020-11-23 MED ORDER — MORPHINE SULFATE (PF) 2 MG/ML IV SOLN: 2.0000 mg | INTRAVENOUS | Status: DC | PRN

## 2020-11-23 MED ORDER — APIXABAN 5 MG PO TABS: 5.0000 mg | ORAL_TABLET | Freq: Two times a day (BID) | ORAL | Status: DC

## 2020-11-23 MED ORDER — APIXABAN 5 MG PO TABS
10.0000 mg | ORAL_TABLET | Freq: Two times a day (BID) | ORAL | Status: DC
  Administered 2020-11-23: 10 mg via ORAL
  Filled 2020-11-23: qty 2

## 2020-11-23 MED ORDER — ONDANSETRON HCL 4 MG PO TABS: 4.0000 mg | ORAL_TABLET | Freq: Four times a day (QID) | ORAL | Status: DC | PRN

## 2020-11-23 NOTE — ED Notes (Addendum)
Pt angry at this RN for telling her brother that he is not allowed to visit. Brother stated that he would leave. COVID rules about visitors explained to pt.

## 2020-11-23 NOTE — Progress Notes (Addendum)
ANTICOAGULATION CONSULT NOTE - Initial Consult  Pharmacy Consult for Heparin  Indication: pulmonary embolism  Allergies  Allergen Reactions  . Vicodin [Hydrocodone-Acetaminophen]     itching    Patient Measurements: Height: 5\' 5"  (165.1 cm) Weight: 97.5 kg (215 lb) IBW/kg (Calculated) : 57 Heparin Dosing Weight: 79.1 kg   Vital Signs: Temp: 98.3 F (36.8 C) (02/14 1823) Temp Source: Oral (02/14 1823) BP: 112/76 (02/15 0124) Pulse Rate: 88 (02/15 0124)  Labs: Recent Labs    11/22/20 1833 11/22/20 2120  HGB 12.7  --   HCT 38.1  --   PLT 163  --   CREATININE 0.78  --   TROPONINIHS <2 3    Estimated Creatinine Clearance: 88.6 mL/min (by C-G formula based on SCr of 0.78 mg/dL).   Medical History: Past Medical History:  Diagnosis Date  . Anxiety   . Bipolar disorder ([place])   . PTSD (post-traumatic stress disorder)   . Vertigo     Medications:  (Not in a hospital admission)   Assessment: Pharmacy consulted to dose heparin in this 60 year old female admitted with NSTEMI/ACS.  No prior anticoag noted.  CrCl = 88.6 ml/min   Goal of Therapy:  Heparin level 0.3-0.7 units/ml Monitor platelets by anticoagulation protocol: Yes   Plan:  Give 4000 units bolus x 1 Start heparin infusion at 950 units/hr Check anti-Xa level in 6 hours and daily while on heparin Continue to monitor H&H and platelets  Ashlea Dusing D 11/23/2020,2:19 AM

## 2020-11-23 NOTE — ED Notes (Signed)
Pt was screaming from her room.  This writer went into her room and asked what I could do for her.  Pt raises her voice and states "turn that damn thing off" referring to her beeping pump.  I reset her pump and asked her to please keep her arm straight because that is why it keeps beeping.  Pt continues to scream at me (not believing that's the reason its beeping)  for various reasons and she was asked to please stop yelling at me.  She states "Ill scream at you if I want.  I want my nurse.  It was explained to the patient that her nurse will be with her as soon as possible (due to 2 medical emergencies in the department at that time).  Pt continued to yell, her call bell was attached to the rail of her bed, and she states "you bitch" as I walked out of the room.

## 2020-11-23 NOTE — ED Notes (Signed)
Admitting at bedside 

## 2020-11-23 NOTE — ED Notes (Signed)
This RN attempted to call daughter Estill Bamberg back at [phone]. No answer.

## 2020-11-23 NOTE — ED Notes (Signed)
Pt ambulated around room on RA. Pt oxygen stayed 93-95% on RA. No resp distress noted. Informed MD.

## 2020-11-23 NOTE — Discharge Summary (Signed)
Physician Discharge Summary  Catherine Pollard TDD:[id number] DOB: 10/30/1960 DOA: 11/22/2020  [facility]P: [facility] [place] date: 11/22/2020 Discharge date: 11/23/2020  Admitted From: Home Disposition: Home  Recommendations for Outpatient Follow-up:  1. Follow up with PCP in 1-2 weeks 2. Please obtain BMP/CBC in one week  Home Health: None Equipment/Devices: None  Discharge Condition: Stable CODE STATUS: Full Diet recommendation: Low-fat, low-salt, low-carb diet  Brief/Interim Summary: Catherine Pollard is a 60 y.o. Caucasian female with medical history significant for malaise and fatigue for the last week and body aches in her lower back that fairly severe. She admitted to loss of taste and smell as well as nausea without vomiting or diarrhea.  She has been having chest pain mainly with deep breathing. She has not been vaccinated against COVID-19.  In the ED chest CTA revealed acute pulmonary embolus with moderate clot burden primarily in the right and near occlusive in the right lower lobe with subsegmental pulmonary emboli in the left upper and lower lobes. Abdominal pelvic CT scan showed gallstones and sludge in the gallbladder without CT findings of acute cholecystitis.  Patient mated as above with acute provoked PE in the setting of questionably acute COVID-19 infection as well as decreased ambulation over the past week.  Her abdominal pain and back pain appear to be secondary to cholelithiasis without choledocholithiasis or cholangitis.  Patient states her abdominal pain is provoked by greasy meals.  Her atypical chest pain is worse with deep inspiration.  At intake patient states she was having some lightheadedness with ambulation but was not hypoxic.  At this time patient is ambulating around the room, feels back to baseline and is requesting discharge home.  We discussed need for ongoing anticoagulation as well as lifestyle modification given our findings as  above.  Patient be discharged on Eliquis, she does not have insurance at this time, patient's daughter who works in the pharmacy has been notified that she will go home with a coupon for 1 month of Eliquis but will need to speak with PCP about refills and will need to obtain medication insurance for cost.  Transitioning to Coumadin is an option but patient requested to continue Eliquis if at all possible given ease of use and no need for repeat labs.  Patient otherwise will need to follow-up with surgery in the near future, likely in the next few weeks for further discussion and evaluation for possible elective procedure for cholecystectomy.  Again patient being on anticoagulation will likely complicate this discussion but we discussed that once patient is appropriately anticoagulated and her symptoms resolve surgery would likely be reasonable in the next few weeks to months.  Patient improved much more drastically than expected, at intake given her initial onset of symptoms there was concern for large clot burden however patient stabilized quite rapidly on heparin infusion.  Discharge Diagnoses:  Active Problems:   Bilateral pulmonary embolism [facility])    Discharge Instructions  Discharge Instructions    Call MD for:   Complete by: As directed    Signs/symptoms of bleeding; black stool, coffee ground vomit   Diet - low sodium heart healthy   Complete by: As directed    Discharge instructions   Complete by: As directed    ?   Person Under Monitoring Name: Catherine Pollard  Location: [place] [address]   Infection Prevention Recommendations for Individuals Confirmed to have, or Being Evaluated for, 2019 Novel Coronavirus (  COVID-19) Infection Who Receive Care at Home  Individuals who are confirmed to have, or are being evaluated for, COVID-19 should follow the prevention steps below until a healthcare provider or local or state health department says they can return  to normal activities.  Stay home except to get medical care You should restrict activities outside your home, except for getting medical care. Do not go to work, school, or public areas, and do not use public transportation or taxis.  Call ahead before visiting your doctor Before your medical appointment, call the healthcare provider and tell them that you have, or are being evaluated for, COVID-19 infection. This will help the healthcare provider's office take steps to keep other people from getting infected. Ask your healthcare provider to call the local or state health department.  Monitor your symptoms Seek prompt medical attention if your illness is worsening (e.g., difficulty breathing). Before going to your medical appointment, call the healthcare provider and tell them that you have, or are being evaluated for, COVID-19 infection. Ask your healthcare provider to call the local or state health department.  Wear a facemask You should wear a facemask that covers your nose and mouth when you are in the same room with other people and when you visit a healthcare provider. People who live with or visit you should also wear a facemask while they are in the same room with you.  Separate yourself from other people in your home As much as possible, you should stay in a different room from other people in your home. Also, you should use a separate bathroom, if available.  Avoid sharing household items You should not share dishes, drinking glasses, cups, eating utensils, towels, bedding, or other items with other people in your home. After using these items, you should wash them thoroughly with soap and water.  Cover your coughs and sneezes Cover your mouth and nose with a tissue when you cough or sneeze, or you can cough or sneeze into your sleeve. Throw used tissues in a lined trash can, and immediately wash your hands with soap and water for at least 20 seconds or use an alcohol-based  hand rub.  Wash your [facility] your hands often and thoroughly with soap and water for at least 20 seconds. You can use an alcohol-based hand sanitizer if soap and water are not available and if your hands are not visibly dirty. Avoid touching your eyes, nose, and mouth with unwashed hands.   Prevention Steps for Caregivers and Household Members of Individuals Confirmed to have, or Being Evaluated for, COVID-19 Infection Being Cared for in the Home  If you live with, or provide care at home for, a person confirmed to have, or being evaluated for, COVID-19 infection please follow these guidelines to prevent infection:  Follow healthcare provider's instructions Make sure that you understand and can help the patient follow any healthcare provider instructions for all care.  Provide for the patient's basic needs You should help the patient with basic needs in the home and provide support for getting groceries, prescriptions, and other personal needs.  Monitor the patient's symptoms If they are getting sicker, call his or her medical provider and tell them that the patient has, or is being evaluated for, COVID-19 infection. This will help the healthcare provider's office take steps to keep other people from getting infected. Ask the healthcare provider to call the local or state health department.  Limit the number of people who have contact with the patient  If possible, have only one caregiver for the patient. Other household members should stay in another home or place of residence. If this is not possible, they should stay in another room, or be separated from the patient as much as possible. Use a separate bathroom, if available. Restrict visitors who do not have an essential need to be in the home.  Keep older adults, very young children, and other sick people away from the patient Keep older adults, very young children, and those who have compromised immune systems or chronic  health conditions away from the patient. This includes people with chronic heart, lung, or kidney conditions, diabetes, and cancer.  Ensure good ventilation Make sure that shared spaces in the home have good air flow, such as from an air conditioner or an opened window, weather permitting.  Wash your hands often Wash your hands often and thoroughly with soap and water for at least 20 seconds. You can use an alcohol based hand sanitizer if soap and water are not available and if your hands are not visibly dirty. Avoid touching your eyes, nose, and mouth with unwashed hands. Use disposable paper towels to dry your hands. If not available, use dedicated cloth towels and replace them when they become wet.  Wear a facemask and gloves Wear a disposable facemask at all times in the room and gloves when you touch or have contact with the patient's blood, body fluids, and/or secretions or excretions, such as sweat, saliva, sputum, nasal mucus, vomit, urine, or feces.  Ensure the mask fits over your nose and mouth tightly, and do not touch it during use. Throw out disposable facemasks and gloves after using them. Do not reuse. Wash your hands immediately after removing your facemask and gloves. If your personal clothing becomes contaminated, carefully remove clothing and launder. Wash your hands after handling contaminated clothing. Place all used disposable facemasks, gloves, and other waste in a lined container before disposing them with other household waste. Remove gloves and wash your hands immediately after handling these items.  Do not share dishes, glasses, or other household items with the patient Avoid sharing household items. You should not share dishes, drinking glasses, cups, eating utensils, towels, bedding, or other items with a patient who is confirmed to have, or being evaluated for, COVID-19 infection. After the person uses these items, you should wash them thoroughly with soap and  water.  Wash laundry thoroughly Immediately remove and wash clothes or bedding that have blood, body fluids, and/or secretions or excretions, such as sweat, saliva, sputum, nasal mucus, vomit, urine, or feces, on them. Wear gloves when handling laundry from the patient. Read and follow directions on labels of laundry or clothing items and detergent. In general, wash and dry with the warmest temperatures recommended on the label.  Clean all areas the individual has used often Clean all touchable surfaces, such as counters, tabletops, doorknobs, bathroom fixtures, toilets, phones, keyboards, tablets, and bedside tables, every day. Also, clean any surfaces that may have blood, body fluids, and/or secretions or excretions on them. Wear gloves when cleaning surfaces the patient has come in contact with. Use a diluted bleach solution (e.g., dilute bleach with 1 part bleach and 10 parts water) or a household disinfectant with a label that says EPA-registered for coronaviruses. To make a bleach solution at home, add 1 tablespoon of bleach to 1 quart (4 cups) of water. For a larger supply, add  cup of bleach to 1 gallon (16 cups) of water. Read labels  of cleaning products and follow recommendations provided on product labels. Labels contain instructions for safe and effective use of the cleaning product including precautions you should take when applying the product, such as wearing gloves or eye protection and making sure you have good ventilation during use of the product. Remove gloves and wash hands immediately after cleaning.  Monitor yourself for signs and symptoms of illness Caregivers and household members are considered close contacts, should monitor their health, and will be asked to limit movement outside of the home to the extent possible. Follow the monitoring steps for close contacts listed on the symptom monitoring form.   ? If you have additional questions, contact your local health  department or call the epidemiologist on call at [phone] (available 24/7). ? This guidance is subject to change. For the most up-to-date guidance from CDC, please refer to their website: YouBlogs.pl   Increase activity slowly   Complete by: As directed      Allergies as of 11/23/2020      Reactions   Vicodin [hydrocodone-acetaminophen]    itching      Medication List    TAKE these medications   Apixaban Starter Pack (10mg  and 5mg ) Commonly known as: ELIQUIS STARTER PACK Take as directed on package: start with two-5mg  tablets twice daily for 7 days. On day 8, switch to one-5mg  tablet twice daily.   busPIRone 5 MG tablet Commonly known as: BUSPAR Take 5 mg by mouth daily.   cyclobenzaprine 5 MG tablet Commonly known as: FLEXERIL Take 1-2 tablets 3 times daily as needed   DULoxetine 60 MG capsule Commonly known as: CYMBALTA Take 60 mg by mouth daily.   HYDROcodone-acetaminophen 5-325 MG tablet Commonly known as: NORCO/VICODIN Take 1 tablet by mouth every 4 (four) hours as needed for moderate pain.   ibuprofen 800 MG tablet Commonly known as: ADVIL Take 1 tablet (800 mg total) by mouth every 8 (eight) hours as needed.   meclizine 25 MG tablet Commonly known as: ANTIVERT Take 25 mg by mouth every 6 (six) hours as needed.       Allergies  Allergen Reactions  . Vicodin [Hydrocodone-Acetaminophen]     itching    Consultations: None  Procedures/Studies: DG Chest 2 View  Result Date: 11/22/2020 CLINICAL DATA:  Chest pain, shortness of breath EXAM: CHEST - 2 VIEW COMPARISON:  09/17/2018 FINDINGS: The heart size and mediastinal contours are within normal limits. Minimal streaky bibasilar opacities. No pleural effusion or pneumothorax. The visualized skeletal structures are unremarkable. IMPRESSION: Minimal streaky bibasilar opacities, atelectasis versus developing infiltrate. Electronically Signed   By:  Davina Poke D.O.   On: 11/22/2020 19:27   CT Angio Chest PE W and/or Wo Contrast  Result Date: 11/23/2020 CLINICAL DATA:  PE suspected, high prob COVID positive Right basilar opacity on recent abdominal CT. EXAM: CT ANGIOGRAPHY CHEST WITH CONTRAST TECHNIQUE: Multidetector CT imaging of the chest was performed using the standard protocol during bolus administration of intravenous contrast. Multiplanar CT image reconstructions and MIPs were obtained to evaluate the vascular anatomy. CONTRAST:  35mL OMNIPAQUE IOHEXOL 350 MG/ML SOLN COMPARISON:  Chest radiograph and abdominal CT yesterday. FINDINGS: Cardiovascular: Positive for acute pulmonary embolus. Filling defects on the right involves the upper and lower lobar branches, as well as many segmental and subsegmental branches. Greatest burden in the right lower lobe where thrombus appears occlusive. There are subsegmental filling defects in the left upper lobe. Suspect subsegmental pulmonary emboli in the left lower lobe, partially obscured by motion. The  RV to LV ratio is 1. Thoracic aorta is normal in caliber without dissection. Common origin of the brachiocephalic and left common carotid artery, variant arch anatomy. Mediastinum/Nodes: Few prominent mediastinal lymph nodes are likely reactive. No hilar adenopathy. Esophagus and thyroid gland are unremarkable. Lungs/Pleura: Ground-glass opacity in the periphery of the right lower lobe may represent pulmonary infarct. Trace right pleural effusions/thickening. There is subsegmental opacities in the lingula and left lower lobe. Trachea and central bronchi are patent. Upper Abdomen: Assessed on abdominal CT yesterday. No interval findings. Musculoskeletal: There are no acute or suspicious osseous abnormalities. Review of the MIP images confirms the above findings. IMPRESSION: 1. Positive for acute pulmonary embolus with moderate clot burden, primarily in the right, and near occlusive in the right lower lobe.  Additional subsegmental pulmonary emboli in the left upper and lower lobes. The RV to LV ratio is 1. 2. Ground-glass opacity in the periphery of the right lower lobe favored to represent pulmonary infarct. 3. Subsegmental opacity in the left lower lobe and lingula, typically atelectasis. Critical Value/emergent results were called by telephone at the time of interpretation on 11/23/2020 at 1:56 am to Dr Pryor Curia , who verbally acknowledged these results. Electronically Signed   By: Keith Rake M.D.   On: 11/23/2020 01:58   CT Abdomen Pelvis W Contrast  Result Date: 11/22/2020 CLINICAL DATA:  Acute abdominal pain. Patient reports back and left flank pain. EXAM: CT ABDOMEN AND PELVIS WITH CONTRAST TECHNIQUE: Multidetector CT imaging of the abdomen and pelvis was performed using the standard protocol following bolus administration of intravenous contrast. CONTRAST:  166mL OMNIPAQUE IOHEXOL 300 MG/ML  SOLN COMPARISON:  None. FINDINGS: Lower chest: Trace right pleural thickening with ground-glass opacity in the dependent right lower lobe. Breathing motion and contrast timing not adequate to assess for lower lobe pulmonary emboli. Hepatobiliary: No focal hepatic abnormality. Gallstones and sludge in the gallbladder which is not abnormally distended. No pericholecystic fat stranding. No biliary dilatation. Pancreas: No ductal dilatation or inflammation. Spleen: Normal in size without focal abnormality. Adrenals/Urinary Tract: Normal adrenal glands. No hydronephrosis or perinephric edema. Homogeneous renal enhancement with symmetric excretion on delayed phase imaging. Urinary bladder is near completely empty and not well assessed. Stomach/Bowel: Decompressed stomach. Normal positioning of the duodenum. No small bowel obstruction or inflammation. Small amount of high-density material in the appendix which is otherwise normal. No appendicitis. Moderate volume of colonic stool. No colonic wall thickening. No  significant diverticular disease. Vascular/Lymphatic: Normal caliber abdominal aorta. Patent portal vein. No enlarged lymph nodes in the abdomen or pelvis. Reproductive: Status post hysterectomy. No adnexal masses. Other: Small fat containing umbilical hernia. No ascites or free air. Musculoskeletal: There are no acute or suspicious osseous abnormalities. IMPRESSION: 1. No acute abnormality in the abdomen/pelvis. 2. Gallstones and sludge in the gallbladder without CT findings of acute cholecystitis. 3. Moderate volume of colonic stool, can be seen with constipation. 4. Trace right pleural thickening. Ground-glass opacity in the dependent right lower lobe, may represent pneumonia in the appropriate clinical setting. Possibility of pulmonary infarct is raised, and if there is clinical concern for pulmonary embolus, recommend chest CTA. Electronically Signed   By: Keith Rake M.D.   On: 11/22/2020 23:50      Subjective: No acute issues or events overnight, ambulation today without any further episodes of dizziness lightheadedness chest pain or shortness of breath.   Discharge Exam: Vitals:   11/23/20 0930 11/23/20 0956  BP:  136/78  Pulse: 95 95  Resp:  15  Temp:    SpO2: 94% 94%   Vitals:   11/23/20 0830 11/23/20 0900 11/23/20 0930 11/23/20 0956  BP:    136/78  Pulse: 93 94 95 95  Resp: (!) 21   15  Temp:      TempSrc:      SpO2: 92% 91% 94% 94%  Weight:      Height:        General: Pt is alert, awake, not in acute distress Cardiovascular: RRR, S1/S2 +, no rubs, no gallops Respiratory: CTA bilaterally, no wheezing, no rhonchi Abdominal: Soft, NT, ND, bowel sounds + Extremities: no edema, no cyanosis    The results of significant diagnostics from this hospitalization (including imaging, microbiology, ancillary and laboratory) are listed below for reference.     Microbiology: Recent Results (from the past 240 hour(s))  Resp Panel by RT-PCR (Flu A&B, Covid) Nasopharyngeal  Swab     Status: Abnormal   Collection Time: 11/22/20 10:20 PM   Specimen: Nasopharyngeal Swab; Nasopharyngeal(NP) swabs in vial transport medium  Result Value Ref Range Status   SARS Coronavirus 2 by RT PCR POSITIVE (A) NEGATIVE Final    Comment: RESULT CALLED TO, READ BACK BY AND VERIFIED WITH: KORY ANDREWS @2328  11/22/20 RH (NOTE) SARS-CoV-2 target nucleic acids are DETECTED.  The SARS-CoV-2 RNA is generally detectable in upper respiratory specimens during the acute phase of infection. Positive results are indicative of the presence of the identified virus, but do not rule out bacterial infection or co-infection with other pathogens not detected by the test. Clinical correlation with patient history and other diagnostic information is necessary to determine patient infection status. The expected result is Negative.  Fact Sheet for Patients: EntrepreneurPulse.com.au  Fact Sheet for Healthcare Providers: IncredibleEmployment.be  This test is not yet approved or cleared by the [place] FDA and  has been authorized for detection and/or diagnosis of SARS-CoV-2 by FDA under an Emergency Use Authorization (EUA).  This EUA will remain in effect (meaning this test can be use d) for the duration of  the COVID-19 declaration under Section 564(b)(1) of the Act, 21 U.S.C. section 360bbb-3(b)(1), unless the authorization is terminated or revoked sooner.     Influenza A by PCR NEGATIVE NEGATIVE Final   Influenza B by PCR NEGATIVE NEGATIVE Final    Comment: (NOTE) The Xpert Xpress SARS-CoV-2/FLU/RSV plus assay is intended as an aid in the diagnosis of influenza from Nasopharyngeal swab specimens and should not be used as a sole basis for treatment. Nasal washings and aspirates are unacceptable for Xpert Xpress SARS-CoV-2/FLU/RSV testing.  Fact Sheet for Patients: EntrepreneurPulse.com.au  Fact Sheet for Healthcare  Providers: IncredibleEmployment.be  This test is not yet approved or cleared by the [place] FDA and has been authorized for detection and/or diagnosis of SARS-CoV-2 by FDA under an Emergency Use Authorization (EUA). This EUA will remain in effect (meaning this test can be used) for the duration of the COVID-19 declaration under Section 564(b)(1) of the Act, 21 U.S.C. section 360bbb-3(b)(1), unless the authorization is terminated or revoked.  Performed at [facility], [address] [place], [place] [address]   Blood Culture (routine x 2)     Status: None (Preliminary result)   Collection Time: 11/23/20  4:41 AM   Specimen: BLOOD  Result Value Ref Range Status   Specimen Description BLOOD LEFT [facility]  Final   Special Requests   Final    BOTTLES DRAWN AEROBIC AND ANAEROBIC Blood Culture adequate volume   Culture  Final    NO GROWTH < 12 HOURS Performed at [facility], [address] [place], [place] [address]    Report Status PENDING  Incomplete     Labs: BNP (last 3 results) No results for input(s): BNP in the last 8760 hours. Basic Metabolic Panel: Recent Labs  Lab 11/22/20 1833  NA 138  K 3.8  CL 105  CO2 22  GLUCOSE 111*  BUN 13  CREATININE 0.78  CALCIUM 8.8*   Liver Function Tests: Recent Labs  Lab 11/22/20 1833 11/23/20 0200  AST 12* 15  ALT 12 14  ALKPHOS 73 78  BILITOT 0.9 1.1  PROT 7.6 7.6  ALBUMIN 3.7 3.6   Recent Labs  Lab 11/22/20 1833  LIPASE 31   No results for input(s): AMMONIA in the last 168 hours. CBC: Recent Labs  Lab 11/22/20 1833  WBC 11.0*  HGB 12.7  HCT 38.1  MCV 82.5  PLT 163   Cardiac Enzymes: No results for input(s): CKTOTAL, CKMB, CKMBINDEX, TROPONINI in the last 168 hours. BNP: Invalid input(s): POCBNP CBG: No results for input(s): GLUCAP in the last 168 hours. D-Dimer No results for input(s): DDIMER in the last 72 hours. Hgb A1c No results for input(s): HGBA1C in  the last 72 hours. Lipid Profile Recent Labs    11/23/20 0219  TRIG 49   Thyroid function studies No results for input(s): TSH, T4TOTAL, T3FREE, THYROIDAB in the last 72 hours.  Invalid input(s): FREET3 Anemia work up Recent Labs    11/23/20 0200  FERRITIN 124   Urinalysis    Component Value Date/Time   COLORURINE AMBER (A) 11/22/2020 2120   APPEARANCEUR HAZY (A) 11/22/2020 2120   LABSPEC 1.034 (H) 11/22/2020 2120   PHURINE 5.0 11/22/2020 2120   GLUCOSEU NEGATIVE 11/22/2020 2120   HGBUR SMALL (A) 11/22/2020 2120   BILIRUBINUR NEGATIVE 11/22/2020 2120   KETONESUR NEGATIVE 11/22/2020 2120   PROTEINUR NEGATIVE 11/22/2020 2120   NITRITE NEGATIVE 11/22/2020 2120   LEUKOCYTESUR NEGATIVE 11/22/2020 2120   Sepsis Labs Invalid input(s): PROCALCITONIN,  WBC,  LACTICIDVEN Microbiology Recent Results (from the past 240 hour(s))  Resp Panel by RT-PCR (Flu A&B, Covid) Nasopharyngeal Swab     Status: Abnormal   Collection Time: 11/22/20 10:20 PM   Specimen: Nasopharyngeal Swab; Nasopharyngeal(NP) swabs in vial transport medium  Result Value Ref Range Status   SARS Coronavirus 2 by RT PCR POSITIVE (A) NEGATIVE Final    Comment: RESULT CALLED TO, READ BACK BY AND VERIFIED WITH: KORY ANDREWS @2328  11/22/20 RH (NOTE) SARS-CoV-2 target nucleic acids are DETECTED.  The SARS-CoV-2 RNA is generally detectable in upper respiratory specimens during the acute phase of infection. Positive results are indicative of the presence of the identified virus, but do not rule out bacterial infection or co-infection with other pathogens not detected by the test. Clinical correlation with patient history and other diagnostic information is necessary to determine patient infection status. The expected result is Negative.  Fact Sheet for Patients: EntrepreneurPulse.com.au  Fact Sheet for Healthcare Providers: IncredibleEmployment.be  This test is not yet approved  or cleared by the [place] FDA and  has been authorized for detection and/or diagnosis of SARS-CoV-2 by FDA under an Emergency Use Authorization (EUA).  This EUA will remain in effect (meaning this test can be use d) for the duration of  the COVID-19 declaration under Section 564(b)(1) of the Act, 21 U.S.C. section 360bbb-3(b)(1), unless the authorization is terminated or revoked sooner.     Influenza A  by PCR NEGATIVE NEGATIVE Final   Influenza B by PCR NEGATIVE NEGATIVE Final    Comment: (NOTE) The Xpert Xpress SARS-CoV-2/FLU/RSV plus assay is intended as an aid in the diagnosis of influenza from Nasopharyngeal swab specimens and should not be used as a sole basis for treatment. Nasal washings and aspirates are unacceptable for Xpert Xpress SARS-CoV-2/FLU/RSV testing.  Fact Sheet for Patients: EntrepreneurPulse.com.au  Fact Sheet for Healthcare Providers: IncredibleEmployment.be  This test is not yet approved or cleared by the [place] FDA and has been authorized for detection and/or diagnosis of SARS-CoV-2 by FDA under an Emergency Use Authorization (EUA). This EUA will remain in effect (meaning this test can be used) for the duration of the COVID-19 declaration under Section 564(b)(1) of the Act, 21 U.S.C. section 360bbb-3(b)(1), unless the authorization is terminated or revoked.  Performed at [facility], [address] [place], [place] [address]   Blood Culture (routine x 2)     Status: None (Preliminary result)   Collection Time: 11/23/20  4:41 AM   Specimen: BLOOD  Result Value Ref Range Status   Specimen Description BLOOD LEFT [facility]  Final   Special Requests   Final    BOTTLES DRAWN AEROBIC AND ANAEROBIC Blood Culture adequate volume   Culture   Final    NO GROWTH < 12 HOURS Performed at [facility], [address] [place], [place] [address]    Report Status PENDING  Incomplete     Time  coordinating discharge: Over 30 minutes  SIGNED:   Little Ishikawa, DO [facility] 11/23/2020, 10:16 AM Pager   If 7PM-7AM, please contact night-coverage [email or web address]

## 2020-11-23 NOTE — ED Notes (Signed)
PT was ambulated around room while on RA. At the beginning of ambulation pt was at 95% RA and at the end of ambulation pt was 91%. At the end of ambulation pt noted she felt dizzy and felt like she was about to pass out. PT HR at the end of ambulation was 110.

## 2020-11-23 NOTE — Progress Notes (Signed)
Remdesivir - Pharmacy Brief Note   O:  ALT: 12  CXR:  SpO2: 91 % on RA with ambulation   A/P:  Remdesivir 200 mg IVPB once followed by 100 mg IVPB daily x 4 days.   Namari Breton D 11/23/2020 2:22 AM

## 2020-11-23 NOTE — H&P (Addendum)
[place]   PATIENT NAME: Catherine Pollard    MR#:  [id number]  DATE OF BIRTH:  1961-06-21  DATE OF ADMISSION:  11/22/2020  PRIMARY CARE PHYSICIAN: [facility]   Patient is coming from: Home  REQUESTING/REFERRING PHYSICIAN: Ward, Delice Bison, MD CHIEF COMPLAINT:   Chief Complaint  Patient presents with  . Back Pain  . Abdominal Pain    HISTORY OF PRESENT ILLNESS:  Catherine Pollard is a 60 y.o. Caucasian female with medical history significant for malaise and fatigue for the last week and body aches in her lower back that were more severe than her labor pain to her report.  She fell 5 somebody was sitting on her back.  She has been sleeping in a recliner.  She admitted to dry cough occasionally productive of clear sputum as well as dyspnea on exertion without wheezing or hemoptysis.  She admitted to loss of taste and smell as well as nausea without vomiting or diarrhea.  She has been having chest pain mainly with deep breathing.  No bleeding diathesis.  No dysuria, oliguria or hematuria or flank pain.  She has right upper quadrant and mild right flank pain with no melena or bright red bleeding per rectum.  She has not been vaccinated against COVID-19. ED Course: Blood pressure was 104/56 with otherwise normal vital signs.  Labs revealed unremarkable CMP.  High-sensitivity troponin I was less than 203 CBC showed mild leukocytosis of 11Influenza antigens were negative.  COVID-19 PCR came back positive UA was unremarkable except for high specific gravity 1034.  Her ferritin came back 124 and lactic acid was 1.5 with procalcitonin of 0.2.  Fibrinogen was 7055) was 4861.54.  INR is 1.1 PT 14.2 with PTT of 26. EKG as reviewed by me : Showed normal sinus rhythm rate of 94 with slightly poor R wave progression. Imaging: Chest x-ray showed minimal streaky bibasilar opacities, atelectasis versus developing infiltrates. Chest CTA revealed acute pulmonary embolus  with moderate clot burden primarily in the right and near occlusive in the right lower lobe with subsegmental pulmonary emboli in the left upper and lower lobes.  RV to LV ratio is 1.  It showed groundglass opacity in the periphery of the right lower lobe favoring pulmonary infarction.  There was subsegmental opacity in the left lower lobe and lingula active Coley atelectasis. Abdominal pelvic CT scan showed gallstones and sludge in the gallbladder without CT findings of acute cholecystitis, moderate volume of colonic stool that can be seen with constipation and trace right pleural thickening with groundglass opacity in the dependent right lower lobe may represent pneumonia in the appropriate clinical setting and possibly pulmonary infarction.  She was ambulated around room while on RA. At the beginning of ambulation pt was at 95% RA and at the end of ambulation pt was 91%. At the end of ambulation pt noted she felt dizzy and felt like she was about to pass out.  Her HR at the end of ambulation was 110.  The patient was given IV heparin bolus and drip and 50 mg of IV Toradol she will be admitted to a progressive unit bed for further evaluation and management. PAST MEDICAL HISTORY:   Past Medical History:  Diagnosis Date  . Anxiety   . Bipolar disorder ([place])   . PTSD (post-traumatic stress disorder)   . Vertigo     PAST SURGICAL HISTORY:   Past Surgical History:  Procedure Laterality Date  . ABDOMINAL  HYSTERECTOMY    . FOOT FRACTURE SURGERY    . TONSILLECTOMY    . WRIST SURGERY      SOCIAL HISTORY:   Social History   Tobacco Use  . Smoking status: Current Every Day Smoker    Packs/day: 0.50    Types: Cigarettes  . Smokeless tobacco: Never Used  Substance Use Topics  . Alcohol use: Not Currently    FAMILY HISTORY:  History reviewed. No pertinent family history.   DRUG ALLERGIES:   Allergies  Allergen Reactions  . Vicodin [Hydrocodone-Acetaminophen]     itching    REVIEW  OF SYSTEMS:   ROS As per history of present illness. All pertinent systems were reviewed above. Constitutional, HEENT, cardiovascular, respiratory, GI, GU, musculoskeletal, neuro, psychiatric, endocrine, integumentary and hematologic systems were reviewed and are otherwise negative/unremarkable except for positive findings mentioned above in the HPI.   MEDICATIONS AT HOME:   Prior to Admission medications   Medication Sig Start Date End Date Taking? Authorizing Provider  busPIRone (BUSPAR) 5 MG tablet Take 5 mg by mouth daily.   Yes [provider]  cyclobenzaprine (FLEXERIL) 5 MG tablet Take 1-2 tablets 3 times daily as needed 11/10/17  Yes Laban Emperor, PA-C  DULoxetine (CYMBALTA) 60 MG capsule Take 60 mg by mouth daily.   Yes [provider]  ibuprofen (ADVIL,MOTRIN) 800 MG tablet Take 1 tablet (800 mg total) by mouth every 8 (eight) hours as needed. 11/10/17  Yes Laban Emperor, PA-C  meclizine (ANTIVERT) 25 MG tablet Take 25 mg by mouth every 6 (six) hours as needed.    Yes [provider]      VITAL SIGNS:  Blood pressure 112/76, pulse 88, temperature 98.3 F (36.8 C), temperature source Oral, resp. rate 18, height 5\' 5"  (1.651 m), weight 97.5 kg, SpO2 95 %.  PHYSICAL EXAMINATION:  Physical Exam  GENERAL:  60 y.o.-year-old Caucasian female patient lying in the bed with no acute distress.  EYES: Pupils equal, round, reactive to light and accommodation. No scleral icterus. Extraocular muscles intact.  HEENT: Head atraumatic, normocephalic. Oropharynx and nasopharynx clear.  NECK:  Supple, no jugular venous distention. No thyroid enlargement, no tenderness.  LUNGS: Slightly diminished bibasal breath sounds with no wheezing, rales,rhonchi or crepitation. No use of accessory muscles of respiration.  CARDIOVASCULAR: Regular rate and rhythm, S1, S2 normal. No murmurs, rubs, or gallops.  ABDOMEN: Soft, nondistended, nontender. Bowel sounds present. No  organomegaly or mass.  EXTREMITIES: No pedal edema, cyanosis, or clubbing.  NEUROLOGIC: Cranial nerves II through XII are intact. Muscle strength 5/5 in all extremities. Sensation intact. Gait not checked.  PSYCHIATRIC: The patient is alert and oriented x 3.  Normal affect and good eye contact. SKIN: No obvious rash, lesion, or ulcer.   LABORATORY PANEL:   CBC Recent Labs  Lab 11/22/20 1833  WBC 11.0*  HGB 12.7  HCT 38.1  PLT 163   ------------------------------------------------------------------------------------------------------------------  Chemistries  Recent Labs  Lab 11/22/20 1833 11/23/20 0200  NA 138  --   K 3.8  --   CL 105  --   CO2 22  --   GLUCOSE 111*  --   BUN 13  --   CREATININE 0.78  --   CALCIUM 8.8*  --   AST 12* 15  ALT 12 14  ALKPHOS 73 78  BILITOT 0.9 1.1   ------------------------------------------------------------------------------------------------------------------  Cardiac Enzymes No results for input(s): TROPONINI in the last 168 hours. ------------------------------------------------------------------------------------------------------------------  RADIOLOGY:  DG Chest 2 View  Result Date: 11/22/2020 CLINICAL DATA:  Chest pain, shortness of breath EXAM: CHEST - 2 VIEW COMPARISON:  09/17/2018 FINDINGS: The heart size and mediastinal contours are within normal limits. Minimal streaky bibasilar opacities. No pleural effusion or pneumothorax. The visualized skeletal structures are unremarkable. IMPRESSION: Minimal streaky bibasilar opacities, atelectasis versus developing infiltrate. Electronically Signed   By: Davina Poke D.O.   On: 11/22/2020 19:27   CT Angio Chest PE W and/or Wo Contrast  Result Date: 11/23/2020 CLINICAL DATA:  PE suspected, high prob COVID positive Right basilar opacity on recent abdominal CT. EXAM: CT ANGIOGRAPHY CHEST WITH CONTRAST TECHNIQUE: Multidetector CT imaging of the chest was performed using the standard  protocol during bolus administration of intravenous contrast. Multiplanar CT image reconstructions and MIPs were obtained to evaluate the vascular anatomy. CONTRAST:  61mL OMNIPAQUE IOHEXOL 350 MG/ML SOLN COMPARISON:  Chest radiograph and abdominal CT yesterday. FINDINGS: Cardiovascular: Positive for acute pulmonary embolus. Filling defects on the right involves the upper and lower lobar branches, as well as many segmental and subsegmental branches. Greatest burden in the right lower lobe where thrombus appears occlusive. There are subsegmental filling defects in the left upper lobe. Suspect subsegmental pulmonary emboli in the left lower lobe, partially obscured by motion. The RV to LV ratio is 1. Thoracic aorta is normal in caliber without dissection. Common origin of the brachiocephalic and left common carotid artery, variant arch anatomy. Mediastinum/Nodes: Few prominent mediastinal lymph nodes are likely reactive. No hilar adenopathy. Esophagus and thyroid gland are unremarkable. Lungs/Pleura: Ground-glass opacity in the periphery of the right lower lobe may represent pulmonary infarct. Trace right pleural effusions/thickening. There is subsegmental opacities in the lingula and left lower lobe. Trachea and central bronchi are patent. Upper Abdomen: Assessed on abdominal CT yesterday. No interval findings. Musculoskeletal: There are no acute or suspicious osseous abnormalities. Review of the MIP images confirms the above findings. IMPRESSION: 1. Positive for acute pulmonary embolus with moderate clot burden, primarily in the right, and near occlusive in the right lower lobe. Additional subsegmental pulmonary emboli in the left upper and lower lobes. The RV to LV ratio is 1. 2. Ground-glass opacity in the periphery of the right lower lobe favored to represent pulmonary infarct. 3. Subsegmental opacity in the left lower lobe and lingula, typically atelectasis. Critical Value/emergent results were called by  telephone at the time of interpretation on 11/23/2020 at 1:56 am to Dr Pryor Curia , who verbally acknowledged these results. Electronically Signed   By: Keith Rake M.D.   On: 11/23/2020 01:58   CT Abdomen Pelvis W Contrast  Result Date: 11/22/2020 CLINICAL DATA:  Acute abdominal pain. Patient reports back and left flank pain. EXAM: CT ABDOMEN AND PELVIS WITH CONTRAST TECHNIQUE: Multidetector CT imaging of the abdomen and pelvis was performed using the standard protocol following bolus administration of intravenous contrast. CONTRAST:  11mL OMNIPAQUE IOHEXOL 300 MG/ML  SOLN COMPARISON:  None. FINDINGS: Lower chest: Trace right pleural thickening with ground-glass opacity in the dependent right lower lobe. Breathing motion and contrast timing not adequate to assess for lower lobe pulmonary emboli. Hepatobiliary: No focal hepatic abnormality. Gallstones and sludge in the gallbladder which is not abnormally distended. No pericholecystic fat stranding. No biliary dilatation. Pancreas: No ductal dilatation or inflammation. Spleen: Normal in size without focal abnormality. Adrenals/Urinary Tract: Normal adrenal glands. No hydronephrosis or perinephric edema. Homogeneous renal enhancement with symmetric excretion on delayed phase imaging. Urinary bladder is near completely empty and not well assessed. Stomach/Bowel: Decompressed stomach. Normal positioning  of the duodenum. No small bowel obstruction or inflammation. Small amount of high-density material in the appendix which is otherwise normal. No appendicitis. Moderate volume of colonic stool. No colonic wall thickening. No significant diverticular disease. Vascular/Lymphatic: Normal caliber abdominal aorta. Patent portal vein. No enlarged lymph nodes in the abdomen or pelvis. Reproductive: Status post hysterectomy. No adnexal masses. Other: Small fat containing umbilical hernia. No ascites or free air. Musculoskeletal: There are no acute or suspicious osseous  abnormalities. IMPRESSION: 1. No acute abnormality in the abdomen/pelvis. 2. Gallstones and sludge in the gallbladder without CT findings of acute cholecystitis. 3. Moderate volume of colonic stool, can be seen with constipation. 4. Trace right pleural thickening. Ground-glass opacity in the dependent right lower lobe, may represent pneumonia in the appropriate clinical setting. Possibility of pulmonary infarct is raised, and if there is clinical concern for pulmonary embolus, recommend chest CTA. Electronically Signed   By: Keith Rake M.D.   On: 11/22/2020 23:50      IMPRESSION AND PLAN:  Active Problems:   Bilateral pulmonary embolism ([place])  1.  Bilateral pulmonary embolism associated with COVID-19 infection, likely with associated pulmonary infarction rather than COVID-19 pneumonia. -The patient will be admitted to a progressive unit bed. -She will be continued on IV heparin drip per protocol. -She had normal ferritin and LDH.  She will therefore not need steroids. -We will monitor her pulse oximetry closely. -Her chest pain is likely secondary to her pulmonary embolism and infarction.  Pain management will be provided. -We will follow daily inflammatory markers. -She will be placed on IV remdesivir.  2.  Cholelithiasis with right upper quadrant abdominal pain. -Pain management will be provided. -General surgery consult can be sought after management of her acute phase of her PE to set a plan for management of her cholelithiasis.  3.  Anxiety and depression. -We will continue BuSpar and Cymbalta.  4.  History of vertigo. -We will place on as needed Antivert 12.5 mg p.o. 3 times daily as needed.  DVT prophylaxis: Lovenox. Code Status: full code. Family Communication:  The plan of care was discussed in details with the patient who requested no family communication at this time I answered all questions. The patient agreed to proceed with the above mentioned plan. Further  management will depend upon hospital course. Disposition Plan: Back to previous home environment Consults called: none. All the records are reviewed and case discussed with ED provider.  Status is: Inpatient  Remains inpatient appropriate because:Ongoing active pain requiring inpatient pain management, Ongoing diagnostic testing needed not appropriate for outpatient work up, Unsafe d/c plan, IV treatments appropriate due to intensity of illness or inability to take PO and Inpatient level of care appropriate due to severity of illness   Dispo: The patient is from: Home              Anticipated d/c is to: Home              Anticipated d/c date is: 3 days              Patient currently is not medically stable to d/c.   Difficult to place patient No  TOTAL TIME TAKING CARE OF THIS PATIENT: 55 minutes.    Christel Mormon M.D on 11/23/2020 at 4:00 AM  [facility]   From 7 PM-7 AM, contact night-coverage [email or web address]  CC: Primary care physician; [facility]

## 2020-11-23 NOTE — ED Notes (Signed)
This RN called all numbers listed in chart. Was able to get in touch with Quillian Quince who stated that I would have to call [facility] for pt. Attempting to get in touch with daughter again.

## 2020-11-28 LAB — CULTURE, BLOOD (ROUTINE X 2)
Culture: NO GROWTH
Special Requests: ADEQUATE

## 2020-12-03 ENCOUNTER — Other Ambulatory Visit: Payer: Self-pay | Admitting: Family Medicine

## 2020-12-31 ENCOUNTER — Other Ambulatory Visit: Payer: Self-pay | Admitting: Family Medicine

## 2021-01-12 ENCOUNTER — Other Ambulatory Visit: Payer: Self-pay

## 2021-01-12 MED FILL — Meclizine HCl Chew Tab 25 MG: ORAL | 15 days supply | Qty: 60 | Fill #0 | Status: AC

## 2021-01-12 MED FILL — Ursodiol Cap 300 MG: ORAL | 30 days supply | Qty: 90 | Fill #0 | Status: AC

## 2021-01-13 ENCOUNTER — Other Ambulatory Visit: Payer: Self-pay

## 2021-01-14 ENCOUNTER — Other Ambulatory Visit: Payer: Self-pay

## 2021-01-14 MED FILL — Trazodone HCl Tab 50 MG: ORAL | 20 days supply | Qty: 60 | Fill #0 | Status: AC

## 2021-01-14 MED FILL — Cyclobenzaprine HCl Tab 5 MG: ORAL | 30 days supply | Qty: 30 | Fill #0 | Status: AC

## 2021-01-14 MED FILL — Duloxetine HCl Enteric Coated Pellets Cap 60 MG (Base Eq): ORAL | 30 days supply | Qty: 30 | Fill #0 | Status: AC

## 2021-01-14 MED FILL — Albuterol Sulfate Inhal Aero 108 MCG/ACT (90MCG Base Equiv): RESPIRATORY_TRACT | 17 days supply | Qty: 6.7 | Fill #0 | Status: AC

## 2021-01-14 MED FILL — Buspirone HCl Tab 10 MG: ORAL | 30 days supply | Qty: 60 | Fill #0 | Status: AC

## 2021-01-14 MED FILL — Apixaban Tab 5 MG: ORAL | 30 days supply | Qty: 60 | Fill #0 | Status: AC

## 2021-01-14 MED FILL — Fluticasone Propionate Nasal Susp 50 MCG/ACT: NASAL | 30 days supply | Qty: 16 | Fill #0 | Status: AC

## 2021-01-17 ENCOUNTER — Other Ambulatory Visit: Payer: Self-pay

## 2021-02-16 ENCOUNTER — Other Ambulatory Visit: Payer: Self-pay

## 2021-02-16 MED FILL — Ursodiol Cap 300 MG: ORAL | 30 days supply | Qty: 90 | Fill #1 | Status: AC

## 2021-02-16 MED FILL — Cyclobenzaprine HCl Tab 5 MG: ORAL | 30 days supply | Qty: 30 | Fill #1 | Status: AC

## 2021-02-16 MED FILL — Apixaban Tab 5 MG: ORAL | 30 days supply | Qty: 60 | Fill #1 | Status: AC

## 2021-02-16 MED FILL — Fluticasone Propionate Nasal Susp 50 MCG/ACT: NASAL | 30 days supply | Qty: 16 | Fill #1 | Status: AC

## 2021-02-16 MED FILL — Meclizine HCl Chew Tab 25 MG: ORAL | 10 days supply | Qty: 40 | Fill #1 | Status: AC

## 2021-02-16 MED FILL — Duloxetine HCl Enteric Coated Pellets Cap 60 MG (Base Eq): ORAL | 30 days supply | Qty: 30 | Fill #1 | Status: AC

## 2021-02-16 MED FILL — Trazodone HCl Tab 50 MG: ORAL | 20 days supply | Qty: 60 | Fill #1 | Status: AC

## 2021-02-16 MED FILL — Buspirone HCl Tab 10 MG: ORAL | 30 days supply | Qty: 60 | Fill #1 | Status: AC

## 2021-02-16 MED FILL — Albuterol Sulfate Inhal Aero 108 MCG/ACT (90MCG Base Equiv): RESPIRATORY_TRACT | 17 days supply | Qty: 6.7 | Fill #1 | Status: AC

## 2021-02-28 ENCOUNTER — Ambulatory Visit: Payer: Self-pay | Admitting: Pharmacy Technician

## 2021-02-28 ENCOUNTER — Other Ambulatory Visit: Payer: Self-pay

## 2021-02-28 DIAGNOSIS — Z79899 Other long term (current) drug therapy: Secondary | ICD-10-CM

## 2021-02-28 NOTE — Progress Notes (Signed)
Completed [facility] application and contract.  Patient agreed to all terms of the [facility] contract.    Patient approved to receive medication assistance at [facility] until time for re-certification in 2023, and as long as eligibility criteria continues to be met.    Provided patient with community resource material based on her particular needs.    Lulu Hirschmann J. Jennaya Pogue Care Manager [facility]  

## 2021-03-09 ENCOUNTER — Other Ambulatory Visit: Payer: Self-pay

## 2021-03-23 ENCOUNTER — Other Ambulatory Visit: Payer: Self-pay

## 2021-03-24 ENCOUNTER — Other Ambulatory Visit: Payer: Self-pay

## 2021-03-31 ENCOUNTER — Other Ambulatory Visit: Payer: Self-pay

## 2021-03-31 MED FILL — Buspirone HCl Tab 10 MG: ORAL | 30 days supply | Qty: 60 | Fill #2 | Status: AC

## 2021-03-31 MED FILL — Apixaban Tab 5 MG: ORAL | 6 days supply | Qty: 12 | Fill #2 | Status: AC

## 2021-03-31 MED FILL — Albuterol Sulfate Inhal Aero 108 MCG/ACT (90MCG Base Equiv): RESPIRATORY_TRACT | 17 days supply | Qty: 6.7 | Fill #2 | Status: AC

## 2021-03-31 MED FILL — Cyclobenzaprine HCl Tab 5 MG: ORAL | 30 days supply | Qty: 30 | Fill #2 | Status: AC

## 2021-03-31 MED FILL — Meclizine HCl Chew Tab 25 MG: ORAL | 10 days supply | Qty: 40 | Fill #2 | Status: AC

## 2021-03-31 MED FILL — Trazodone HCl Tab 50 MG: ORAL | 20 days supply | Qty: 60 | Fill #2 | Status: AC

## 2021-03-31 MED FILL — Duloxetine HCl Enteric Coated Pellets Cap 60 MG (Base Eq): ORAL | 30 days supply | Qty: 30 | Fill #2 | Status: AC

## 2021-03-31 MED FILL — Fluticasone Propionate Nasal Susp 50 MCG/ACT: NASAL | 30 days supply | Qty: 16 | Fill #2 | Status: AC

## 2021-04-01 ENCOUNTER — Other Ambulatory Visit: Payer: Self-pay

## 2021-04-01 MED ORDER — APIXABAN 5 MG PO TABS
1.0000 | ORAL_TABLET | Freq: Two times a day (BID) | ORAL | 11 refills | Status: DC
  Filled 2021-04-01 (×2): qty 60, 30d supply, fill #0
  Filled 2021-05-31: qty 60, 30d supply, fill #1

## 2021-04-04 ENCOUNTER — Other Ambulatory Visit: Payer: Self-pay

## 2021-05-18 ENCOUNTER — Telehealth: Payer: Self-pay | Admitting: Pharmacist

## 2021-05-18 NOTE — Telephone Encounter (Signed)
05/18/2021 1:44:43 PM - MAILING [place] - Wednesday, May 18, 2021 1:43 PM -- MAILED [facility] APPLICATION TO PATIENT [id number] TO SIGN & RETURN, NOT RETURNED. MAILING DENIAL TO PATIENT FOR THIS [place] TO ORDER, DUE TO NOT RETURNING PAPERWORK.

## 2021-05-31 ENCOUNTER — Other Ambulatory Visit: Payer: Self-pay

## 2021-05-31 MED FILL — Albuterol Sulfate Inhal Aero 108 MCG/ACT (90MCG Base Equiv): RESPIRATORY_TRACT | 17 days supply | Qty: 6.7 | Fill #3 | Status: AC

## 2021-05-31 MED FILL — Buspirone HCl Tab 10 MG: ORAL | 30 days supply | Qty: 60 | Fill #3 | Status: AC

## 2021-05-31 MED FILL — Ursodiol Cap 300 MG: ORAL | 30 days supply | Qty: 90 | Fill #2 | Status: AC

## 2021-05-31 MED FILL — Duloxetine HCl Enteric Coated Pellets Cap 60 MG (Base Eq): ORAL | 30 days supply | Qty: 30 | Fill #3 | Status: AC

## 2021-05-31 MED FILL — Meclizine HCl Chew Tab 25 MG: ORAL | 10 days supply | Qty: 40 | Fill #3 | Status: AC

## 2021-06-01 ENCOUNTER — Other Ambulatory Visit: Payer: Self-pay

## 2021-06-02 ENCOUNTER — Other Ambulatory Visit: Payer: Self-pay

## 2021-06-03 ENCOUNTER — Other Ambulatory Visit: Payer: Self-pay

## 2021-06-06 ENCOUNTER — Other Ambulatory Visit: Payer: Self-pay

## 2021-06-08 ENCOUNTER — Other Ambulatory Visit: Payer: Self-pay

## 2021-06-08 MED ORDER — FLUTICASONE PROPIONATE 50 MCG/ACT NA SUSP
NASAL | 11 refills | Status: DC
  Filled 2021-06-08: qty 16, 30d supply, fill #0
  Filled 2021-11-07: qty 16, 30d supply, fill #1
  Filled 2021-12-31: qty 16, 30d supply, fill #2
  Filled 2022-02-08: qty 16, 30d supply, fill #3
  Filled 2022-02-27 (×2): qty 16, 30d supply, fill #0
  Filled 2022-03-26 – 2022-03-28 (×2): qty 16, 30d supply, fill #1

## 2021-06-08 MED ORDER — TRAZODONE HCL 50 MG PO TABS
ORAL_TABLET | ORAL | 11 refills | Status: DC
  Filled 2021-06-08: qty 60, 20d supply, fill #0
  Filled 2021-06-23 – 2021-07-11 (×2): qty 60, 20d supply, fill #1
  Filled 2021-11-07: qty 60, 20d supply, fill #2

## 2021-06-09 ENCOUNTER — Other Ambulatory Visit: Payer: Self-pay

## 2021-06-15 ENCOUNTER — Other Ambulatory Visit: Payer: Self-pay

## 2021-06-23 ENCOUNTER — Other Ambulatory Visit: Payer: Self-pay

## 2021-06-23 MED FILL — Cyclobenzaprine HCl Tab 5 MG: ORAL | 30 days supply | Qty: 30 | Fill #3 | Status: AC

## 2021-07-04 ENCOUNTER — Other Ambulatory Visit: Payer: Self-pay

## 2021-07-11 ENCOUNTER — Other Ambulatory Visit: Payer: Self-pay

## 2021-07-11 MED FILL — Albuterol Sulfate Inhal Aero 108 MCG/ACT (90MCG Base Equiv): RESPIRATORY_TRACT | 17 days supply | Qty: 6.7 | Fill #4 | Status: AC

## 2021-07-11 MED FILL — Meclizine HCl Chew Tab 25 MG: ORAL | 10 days supply | Qty: 40 | Fill #4 | Status: AC

## 2021-07-12 ENCOUNTER — Other Ambulatory Visit: Payer: Self-pay

## 2021-07-25 ENCOUNTER — Other Ambulatory Visit: Payer: Self-pay

## 2021-07-25 MED FILL — Ursodiol Cap 300 MG: ORAL | 30 days supply | Qty: 90 | Fill #3 | Status: AC

## 2021-07-27 ENCOUNTER — Other Ambulatory Visit: Payer: Self-pay

## 2021-07-28 ENCOUNTER — Other Ambulatory Visit: Payer: Self-pay

## 2021-07-28 MED ORDER — DULOXETINE HCL 60 MG PO CPEP
ORAL_CAPSULE | ORAL | 3 refills | Status: DC
  Filled 2021-07-28 (×2): qty 60, 30d supply, fill #0
  Filled 2021-09-13: qty 60, 30d supply, fill #1
  Filled 2021-11-14 – 2021-11-15 (×2): qty 60, 30d supply, fill #2
  Filled 2022-01-02: qty 60, 30d supply, fill #3
  Filled 2022-02-08: qty 60, 30d supply, fill #4
  Filled 2022-02-27 (×2): qty 60, 30d supply, fill #0
  Filled 2022-03-26: qty 60, 30d supply, fill #1
  Filled 2022-03-28: qty 180, 90d supply, fill #1

## 2021-08-09 ENCOUNTER — Other Ambulatory Visit: Payer: Self-pay

## 2021-08-09 MED FILL — Buspirone HCl Tab 10 MG: ORAL | 22 days supply | Qty: 44 | Fill #4 | Status: AC

## 2021-08-09 MED FILL — Cyclobenzaprine HCl Tab 5 MG: ORAL | 30 days supply | Qty: 30 | Fill #4 | Status: AC

## 2021-08-10 ENCOUNTER — Other Ambulatory Visit: Payer: Self-pay

## 2021-08-17 ENCOUNTER — Other Ambulatory Visit: Payer: Self-pay

## 2021-09-13 ENCOUNTER — Other Ambulatory Visit: Payer: Self-pay

## 2021-09-13 MED FILL — Meclizine HCl Chew Tab 25 MG: ORAL | 15 days supply | Qty: 60 | Fill #5 | Status: AC

## 2021-09-13 MED FILL — Buspirone HCl Tab 10 MG: ORAL | 30 days supply | Qty: 60 | Fill #5 | Status: AC

## 2021-09-13 MED FILL — Cyclobenzaprine HCl Tab 5 MG: ORAL | 30 days supply | Qty: 30 | Fill #5 | Status: AC

## 2021-09-26 ENCOUNTER — Other Ambulatory Visit: Payer: Self-pay

## 2021-10-11 ENCOUNTER — Other Ambulatory Visit: Payer: Self-pay

## 2021-11-07 ENCOUNTER — Other Ambulatory Visit: Payer: Self-pay

## 2021-11-07 MED ORDER — ALBUTEROL SULFATE HFA 108 (90 BASE) MCG/ACT IN AERS
2.0000 | INHALATION_SPRAY | RESPIRATORY_TRACT | 5 refills | Status: DC
  Filled 2021-11-07: qty 6.7, 17d supply, fill #0

## 2021-11-07 MED FILL — Meclizine HCl Chew Tab 25 MG: ORAL | 15 days supply | Qty: 60 | Fill #6 | Status: CN

## 2021-11-07 MED FILL — Ursodiol Cap 300 MG: ORAL | 30 days supply | Qty: 90 | Fill #4 | Status: CN

## 2021-11-08 ENCOUNTER — Other Ambulatory Visit: Payer: Self-pay

## 2021-11-14 ENCOUNTER — Other Ambulatory Visit: Payer: Self-pay

## 2021-11-15 ENCOUNTER — Other Ambulatory Visit: Payer: Self-pay

## 2021-11-18 ENCOUNTER — Other Ambulatory Visit: Payer: Self-pay

## 2021-11-21 ENCOUNTER — Ambulatory Visit: Payer: Medicaid Other | Admitting: Surgery

## 2021-11-22 ENCOUNTER — Other Ambulatory Visit: Payer: Self-pay

## 2021-11-25 ENCOUNTER — Other Ambulatory Visit: Payer: Self-pay

## 2021-11-25 ENCOUNTER — Telehealth: Payer: Self-pay

## 2021-11-25 ENCOUNTER — Ambulatory Visit (INDEPENDENT_AMBULATORY_CARE_PROVIDER_SITE_OTHER): Payer: Medicaid Other | Admitting: Surgery

## 2021-11-25 ENCOUNTER — Encounter: Payer: Self-pay | Admitting: Surgery

## 2021-11-25 VITALS — BP 135/82 | HR 96 | Temp 98.4°F | Ht 65.0 in | Wt 221.4 lb

## 2021-11-25 DIAGNOSIS — K8020 Calculus of gallbladder without cholecystitis without obstruction: Secondary | ICD-10-CM

## 2021-11-25 NOTE — Patient Instructions (Signed)
Our surgery scheduler will call you within 24-48 hours to schedule your surgery. Please have the blue surgery sheet available when speaking with her.   Cholelithiasis Cholelithiasis is a disease in which gallstones form in the gallbladder. The gallbladder is an organ that stores bile. Bile is a fluid that helps to digest fats. Gallstones begin as small crystals and can slowly grow into stones. They may cause no symptoms until they block the gallbladder duct, or cystic duct, when the gallbladder tightens (contracts) after food is eaten. This can cause pain and is known as a gallbladder attack, or biliary colic. There are two main types of gallstones: Cholesterol stones. These are the most common type of gallstone. These stones are made of hardened cholesterol and are usually yellow-green in color. Cholesterol is a fat-like substance that is made in the liver. Pigment stones. These are dark in color and are made of a red-yellow substance, called bilirubin,that forms when hemoglobin from red blood cells breaks down. What are the causes? This condition may be caused by an imbalance in the different parts that make bile. This can happen if the bile: Has too much bilirubin. This can happen in certain blood diseases, such as sickle cell anemia. Has too much cholesterol. Does not have enough bile salts. These salts help the body absorb and digest fats. In some cases, this condition can also be caused by the gallbladder not emptying completely or often enough. This is common during pregnancy. What increases the risk? The following factors may make you more likely to develop this condition: Being female. Having multiple pregnancies. Health care providers sometimes advise removing diseased gallbladders before future pregnancies. Eating a diet that is heavy in fried foods, fat, and refined carbohydrates, such as white bread and white rice. Being obese. Being older than age 39. Using medicines that contain  female hormones (estrogen) for a long time. Losing weight quickly. Having a family history of gallstones. Having certain medical problems, such as: Diabetes mellitus. Cystic fibrosis. Crohn's disease. Cirrhosis or other long-term (chronic) liver disease. Certain blood diseases, such as sickle cell anemia or leukemia. What are the signs or symptoms? In many cases, having gallstones causes no symptoms. When you have gallstones but do not have symptoms, you have silent gallstones. If a gallstone blocks your bile duct, it can cause a gallbladder attack. The main symptom of a gallbladder attack is sudden pain in the upper right part of the abdomen. The pain: Usually comes at night or after eating. Can last for one hour or more. Can spread to your right shoulder, back, or chest. Can feel like indigestion. This is discomfort, burning, or fullness in your upper abdomen. If the bile duct is blocked for more than a few hours, it can cause an infection or inflammation of your gallbladder (cholecystitis), liver, or pancreas. This can cause: Nausea or vomiting. Bloating. Pain in your abdomen that lasts for 5 hours or longer. Tenderness in your upper abdomen, often in the upper right section and under your rib cage. Fever or chills. Skin or the white parts of your eyes turning yellow (jaundice). This usually happens when a stone has blocked bile from passing through the common bile duct. Dark urine or light-colored stools. How is this diagnosed? This condition may be diagnosed based on: A physical exam. Your medical history. Ultrasound. CT scan. MRI. You may also have other tests, including: Blood tests to check for signs of an infection or inflammation. Cholescintigraphy, or HIDA scan. This is a scan  of your gallbladder and bile ducts (biliary system) using non-harmful radioactive material and special cameras that can see the radioactive material. Endoscopic retrograde cholangiopancreatogram.  This involves inserting a small tube with a camera on the end (endoscope) through your mouth to look at bile ducts and check for blockages. How is this treated? Treatment for this condition depends on the severity of the condition. Silent gallstones do not need treatment. Treatment may be needed if a blockage causes a gallbladder attack or other symptoms. Treatment may include: Home care, if symptoms are not severe. During a simple gallbladder attack, stop eating and drinking for 12-24 hours (except for water and clear liquids). This helps to "cool down" your gallbladder. After 1 or 2 days, you can start to eat a diet of simple or clear foods, such as broths and crackers. You may also need medicines for pain or nausea or both. If you have cholecystitis and an infection, you will need antibiotics. A hospital stay, if needed for pain control or for cholecystitis with severe infection. Cholecystectomy, or surgery to remove your gallbladder. This is the most common treatment if all other treatments have not worked. Medicines to break up gallstones. These are most effective at treating small gallstones. Medicines may be used for up to 6-12 months. Endoscopic retrograde cholangiopancreatogram. A small basket can be attached to the endoscope and used to capture and remove gallstones, mainly those that are in the common bile duct. Follow these instructions at home: Medicines Take over-the-counter and prescription medicines only as told by your health care provider. If you were prescribed an antibiotic medicine, take it as told by your health care provider. Do not stop taking the antibiotic even if you start to feel better. Ask your health care provider if the medicine prescribed to you requires you to avoid driving or using machinery. Eating and drinking Drink enough fluid to keep your urine pale yellow. This is important during a gallbladder attack. Water and clear liquids are preferred. Follow a healthy  diet. This includes: Reducing fatty foods, such as fried food and foods high in cholesterol. Reducing refined carbohydrates, such as white bread and white rice. Eating more fiber. Aim for foods such as almonds, fruit, and beans. Alcohol use If you drink alcohol: Limit how much you use to: 0-1 drink a day for nonpregnant women. 0-2 drinks a day for men. Be aware of how much alcohol is in your drink. In the [place] one drink equals one 12 oz bottle of beer (355 mL), one 5 oz glass of wine (148 mL), or one 1 oz glass of hard liquor (44 mL). General instructions Do not use any products that contain nicotine or tobacco, such as cigarettes, e-cigarettes, and chewing tobacco. If you need help quitting, ask your health care provider. Maintain a healthy weight. Keep all follow-up visits as told by your health care provider. These may include consultations with a surgeon or specialist. This is important. Where to find more information [employer] of Diabetes and Digestive and Kidney Diseases: [email or web address] Contact a health care provider if: You think you have had a gallbladder attack. You have been diagnosed with silent gallstones and you develop pain in your abdomen or indigestion. You begin to have attacks more often. You have dark urine or light-colored stools. Get help right away if: You have pain from a gallbladder attack that lasts for more than 2 hours. You have pain in your abdomen that lasts for more than 5 hours or is getting worse. You  have a fever or chills. You have nausea and vomiting that do not go away. You develop jaundice. Summary Cholelithiasis is a disease in which gallstones form in the gallbladder. This condition may be caused by an imbalance in the different parts that make bile. This can happen if your bile has too much bilirubin or cholesterol, or does not have enough bile salts. Treatment for gallstones depends on the severity of the condition. Silent  gallstones do not need treatment. If gallstones cause a gallbladder attack or other symptoms, treatment usually involves not eating or drinking anything. Treatment may also include pain medicines and antibiotics, and it sometimes includes a hospital stay. Surgery to remove the gallbladder is common if all other treatments have not worked. This information is not intended to replace advice given to you by your health care provider. Make sure you discuss any questions you have with your health care provider. Document Revised: 08/18/2019 Document Reviewed: 08/18/2019 Elsevier Patient Education  2022 [employer] [place]. Minimally Invasive Cholecystectomy Minimally invasive cholecystectomy is surgery to remove the gallbladder. The gallbladder is a pear-shaped organ that lies beneath the liver on the right side of the body. The gallbladder stores bile, which is a fluid that helps the body digest fats. Cholecystectomy is often done to treat inflammation (irritation and swelling) of the gallbladder (cholecystitis). This condition is usually caused by a buildup of gallstones (cholelithiasis) in the gallbladder or when the fluid in the gall bladder becomes stagnant because gallstones get stuck in the ducts (tubes) and block the flow of bile. This can result in inflammation and pain. In severe cases, emergency surgery may be required. This procedure is done through small incisions in the abdomen, instead of one large incision. It is also called laparoscopic surgery. A thin scope with a camera (laparoscope) is inserted through one incision. Then surgical instruments are inserted through the other incisions. In some cases, a minimally invasive surgery may need to be changed to a surgery that is done through a larger incision. This is called open surgery. Tell a health care provider about: Any allergies you have. All medicines you are taking, including vitamins, herbs, eye drops, creams, and over-the-counter medicines. Any  problems you or family members have had with anesthetic medicines. Any bleeding problems you have. Any surgeries you have had. Any medical conditions you have. Whether you are pregnant or may be pregnant. What are the risks? Generally, this is a safe procedure. However, problems may occur, including: Infection. Bleeding. Allergic reactions to medicines. Damage to nearby structures or organs. A gallstone remaining in the common bile duct. The common bile duct carries bile from the gallbladder to the small intestine. A bile leak from the liver or cystic duct after your gallbladder is removed. What happens before the procedure? When to stop eating and drinking Follow instructions from your health care provider about what you may eat and drink before your procedure. These may include: 8 hours before the procedure Stop eating most foods. Do not eat meat, fried foods, or fatty foods. Eat only light foods, such as toast or crackers. All liquids are okay except energy drinks and alcohol. 6 hours before the procedure Stop eating. Drink only clear liquids, such as water, clear fruit juice, black coffee, plain tea, and sports drinks. Do not drink energy drinks or alcohol. 2 hours before the procedure Stop drinking all liquids. You may be allowed to take medicines with small sips of water. If you do not follow your health care provider's instructions, your  procedure may be delayed or canceled. Medicines Ask your health care provider about: Changing or stopping your regular medicines. This is especially important if you are taking diabetes medicines or blood thinners. Taking medicines such as aspirin and ibuprofen. These medicines can thin your blood. Do not take these medicines unless your health care provider tells you to take them. Taking over-the-counter medicines, vitamins, herbs, and supplements. General instructions If you will be going home right after the procedure, plan to have a  responsible adult: Take you home from the hospital or clinic. You will not be allowed to drive. Care for you for the time you are told. Do not use any products that contain nicotine or tobacco for at least 4 weeks before the procedure. These products include cigarettes, chewing tobacco, and vaping devices, such as e-cigarettes. If you need help quitting, ask your health care provider. Ask your health care provider: How your surgery site will be marked. What steps will be taken to help prevent infection. These may include: Removing hair at the surgery site. Washing skin with a germ-killing soap. Taking antibiotic medicine. What happens during the procedure?  An IV will be inserted into one of your veins. You will be given one or both of the following: A medicine to help you relax (sedative). A medicine to make you fall asleep (general anesthetic). Your surgeon will make several small incisions in your abdomen. The laparoscope will be inserted through one of the small incisions. The camera on the laparoscope will send images to a monitor in the operating room. This lets your surgeon see inside your abdomen. A gas will be pumped into your abdomen. This will expand your abdomen to give the surgeon more room to perform the surgery. Other tools that are needed for the procedure will be inserted through the other incisions. The gallbladder will be removed through one of the incisions. Your common bile duct may be examined. If stones are found in the common bile duct, they may be removed. After your gallbladder has been removed, the incisions will be closed with stitches (sutures), staples, or skin glue. Your incisions will be covered with a bandage (dressing). The procedure may vary among health care providers and hospitals. What happens after the procedure? Your blood pressure, heart rate, breathing rate, and blood oxygen level will be monitored until you leave the hospital or clinic. You will be  given medicines as needed to control your pain. You may have a drain placed in the incision. The drain will be removed a day or two after the procedure. Summary Minimally invasive cholecystectomy, also called laparoscopic cholecystectomy, is surgery to remove the gallbladder using small incisions. Tell your health care provider about all the medical conditions you have and all the medicines you are taking for those conditions. Before the procedure, follow instructions about when to stop eating and drinking and changing or stopping medicines. Plan to have a responsible adult care for you for the time you are told after you leave the hospital or clinic. This information is not intended to replace advice given to you by your health care provider. Make sure you discuss any questions you have with your health care provider. Document Revised: 03/29/2021 Document Reviewed: 03/29/2021 Elsevier Patient Education  [employer].

## 2021-11-25 NOTE — Progress Notes (Signed)
11/25/2021  Reason for Visit:  Symptomatic cholelithiasis  Requesting Provider:  Beverlyn Roux, MD  History of Present Illness: Catherine Pollard is a 61 y.o. female presenting for evaluation of symptomatic cholelithiasis.  The patient has a history of right-sided pulmonary embolism on 11/23/20.  She had been to the ED on 11/22/20 for abdominal pain and had CT scan abdomen/pelvis which showed cholelithiasis and sludge without evidence of cholecystitis.  She has since recovered from her PE and is no longer on anticoagulation.  However, she has continued to have pain in the right upper quadrant with radiation to the right back and right shoulder.  She reports that the pain is very frequent now.  Sometimes it's after meals, and sometimes it wakes her up from sleep.  Denies any nausea or vomiting.  Denies any fevers, chills, chest pain, shortness of breath.  Has fully recovered from her COVID and PE.   Past Medical History: Past Medical History:  Diagnosis Date   Anxiety    Bipolar disorder ([place])    PTSD (post-traumatic stress disorder)    Vertigo      Past Surgical History: Past Surgical History:  Procedure Laterality Date   ABDOMINAL HYSTERECTOMY     ANKLE FRACTURE SURGERY Bilateral    FOOT FRACTURE SURGERY     KNEE SURGERY     TONSILLECTOMY     WRIST SURGERY      Home Medications: Prior to Admission medications   Medication Sig Start Date End Date Taking? Authorizing Provider  busPIRone (BUSPAR) 10 MG tablet Take 10 mg by mouth daily.   Yes [provider]  cyclobenzaprine (FLEXERIL) 5 MG tablet Take 1-2 tablets 3 times daily as needed 11/10/17  Yes Laban Emperor, PA-C  DULoxetine (CYMBALTA) 60 MG capsule TAKE 2 CAPSULES BY MOUTH ONCE DAILY FOR MOOD. Patient taking differently: Take 60 mg by mouth 2 (two) times daily. 07/28/21  Yes   fluticasone (FLONASE) 50 MCG/ACT nasal spray PLACE 2 SPRAYS INTO BOTH NOSTRILS ONCE A DAY FOR ALLERGIES 06/08/21 06/08/22 Yes   meclizine  (ANTIVERT) 25 MG tablet Take 25 mg by mouth every 6 (six) hours as needed for dizziness.   Yes [provider]  oxyCODONE-acetaminophen (PERCOCET/ROXICET) 5-325 MG tablet Take 0.5 tablets by mouth 4 (four) times daily as needed for moderate pain. 11/22/21  Yes [provider]  albuterol (VENTOLIN HFA) 108 (90 Base) MCG/ACT inhaler Inhale 2 puffs into the lungs every 6 (six) hours as needed for wheezing or shortness of breath.    [provider]    Allergies: Allergies  Allergen Reactions   Vicodin [Hydrocodone-Acetaminophen] Itching    Social History:  reports that she has been smoking cigarettes. She has been smoking an average of 1 pack per day. She has never used smokeless tobacco. She reports that she does not currently use alcohol. She reports that she does not use drugs.   Family History: History reviewed. No pertinent family history.  Review of Systems: Review of Systems  Constitutional:  Negative for chills and fever.  HENT:  Negative for hearing loss.   Respiratory:  Negative for shortness of breath.   Cardiovascular:  Negative for chest pain.  Gastrointestinal:  Positive for abdominal pain. Negative for constipation, diarrhea, nausea and vomiting.  Genitourinary:  Negative for dysuria.  Musculoskeletal:  Negative for myalgias.  Skin:  Negative for rash.  Neurological:  Negative for dizziness.  Psychiatric/Behavioral:  Negative for depression.    Physical Exam BP 135/82    Pulse  96    Temp 98.4 F (36.9 C) (Oral)    Ht 5\' 5"  (1.651 m)    Wt 221 lb 6.4 oz (100.4 kg)    SpO2 95%    BMI 36.84 kg/m  CONSTITUTIONAL: No acute distress, well nourished. HEENT:  Normocephalic, atraumatic, extraocular motion intact. NECK: Trachea is midline, and there is no jugular venous distension.  RESPIRATORY:  Lungs are clear, and breath sounds are equal bilaterally. Normal respiratory effort without pathologic use of accessory muscles. CARDIOVASCULAR: Heart is  regular without murmurs, gallops, or rubs. GI: The abdomen is soft, non-distended, with some soreness to palpation in the right upper quadrant.  Negative Murphy's sign.  MUSCULOSKELETAL:  Normal muscle strength and tone in all four extremities.  No peripheral edema or cyanosis. SKIN: Skin turgor is normal. There are no pathologic skin lesions.  NEUROLOGIC:  Motor and sensation is grossly normal.  Cranial nerves are grossly intact. PSYCH:  Alert and oriented to person, place and time. Affect is normal.  Imaging: CT abdomen/pelvis 11/22/20: IMPRESSION: 1. No acute abnormality in the abdomen/pelvis. 2. Gallstones and sludge in the gallbladder without CT findings of acute cholecystitis. 3. Moderate volume of colonic stool, can be seen with constipation. 4. Trace right pleural thickening. Ground-glass opacity in the dependent right lower lobe, may represent pneumonia in the appropriate clinical setting. Possibility of pulmonary infarct is raised, and if there is clinical concern for pulmonary embolus, recommend chest CTA.  Assessment and Plan: This is a 61 y.o. female with symptomatic cholelithiasis  --Discussed with the patient that her symptoms of RUQ pain are consistent with her diagnosis of cholelithiasis.  She does not have other symptoms from it, but reports that her symptoms are getting more frequent.  She is interested in surgery.  Discussed with her the role for a robotic assisted cholecystectomy with ICG cholangiogram, and discussed with her the surgery at length, including risks of bleeding, infection, injury to surrounding structures, post-operative activity restrictions, and she's willing to proceed. -Will schedule her for 12/06/21.  Will send clearance form to Dr. Sherril Cong.  I spent 80 minutes dedicated to the care of this patient on the date of this encounter to include pre-visit review of records, face-to-face time with the patient discussing diagnosis and management, and any  post-visit coordination of care.   Melvyn Neth, [facility]

## 2021-11-25 NOTE — Telephone Encounter (Signed)
Medical Clearance faxed to [place] at [facility].

## 2021-11-25 NOTE — H&P (View-Only) (Signed)
11/25/2021  Reason for Visit:  Symptomatic cholelithiasis  Requesting Provider:  Beverlyn Roux, MD  History of Present Illness: Catherine Pollard is a 61 y.o. female presenting for evaluation of symptomatic cholelithiasis.  The patient has a history of right-sided pulmonary embolism on 11/23/20.  She had been to the ED on 11/22/20 for abdominal pain and had CT scan abdomen/pelvis which showed cholelithiasis and sludge without evidence of cholecystitis.  She has since recovered from her PE and is no longer on anticoagulation.  However, she has continued to have pain in the right upper quadrant with radiation to the right back and right shoulder.  She reports that the pain is very frequent now.  Sometimes it's after meals, and sometimes it wakes her up from sleep.  Denies any nausea or vomiting.  Denies any fevers, chills, chest pain, shortness of breath.  Has fully recovered from her COVID and PE.   Past Medical History: Past Medical History:  Diagnosis Date   Anxiety    Bipolar disorder ([place])    PTSD (post-traumatic stress disorder)    Vertigo      Past Surgical History: Past Surgical History:  Procedure Laterality Date   ABDOMINAL HYSTERECTOMY     ANKLE FRACTURE SURGERY Bilateral    FOOT FRACTURE SURGERY     KNEE SURGERY     TONSILLECTOMY     WRIST SURGERY      Home Medications: Prior to Admission medications   Medication Sig Start Date End Date Taking? Authorizing Provider  busPIRone (BUSPAR) 10 MG tablet Take 10 mg by mouth daily.   Yes [provider]  cyclobenzaprine (FLEXERIL) 5 MG tablet Take 1-2 tablets 3 times daily as needed 11/10/17  Yes Laban Emperor, PA-C  DULoxetine (CYMBALTA) 60 MG capsule TAKE 2 CAPSULES BY MOUTH ONCE DAILY FOR MOOD. Patient taking differently: Take 60 mg by mouth 2 (two) times daily. 07/28/21  Yes   fluticasone (FLONASE) 50 MCG/ACT nasal spray PLACE 2 SPRAYS INTO BOTH NOSTRILS ONCE A DAY FOR ALLERGIES 06/08/21 06/08/22 Yes   meclizine  (ANTIVERT) 25 MG tablet Take 25 mg by mouth every 6 (six) hours as needed for dizziness.   Yes [provider]  oxyCODONE-acetaminophen (PERCOCET/ROXICET) 5-325 MG tablet Take 0.5 tablets by mouth 4 (four) times daily as needed for moderate pain. 11/22/21  Yes [provider]  albuterol (VENTOLIN HFA) 108 (90 Base) MCG/ACT inhaler Inhale 2 puffs into the lungs every 6 (six) hours as needed for wheezing or shortness of breath.    [provider]    Allergies: Allergies  Allergen Reactions   Vicodin [Hydrocodone-Acetaminophen] Itching    Social History:  reports that she has been smoking cigarettes. She has been smoking an average of 1 pack per day. She has never used smokeless tobacco. She reports that she does not currently use alcohol. She reports that she does not use drugs.   Family History: History reviewed. No pertinent family history.  Review of Systems: Review of Systems  Constitutional:  Negative for chills and fever.  HENT:  Negative for hearing loss.   Respiratory:  Negative for shortness of breath.   Cardiovascular:  Negative for chest pain.  Gastrointestinal:  Positive for abdominal pain. Negative for constipation, diarrhea, nausea and vomiting.  Genitourinary:  Negative for dysuria.  Musculoskeletal:  Negative for myalgias.  Skin:  Negative for rash.  Neurological:  Negative for dizziness.  Psychiatric/Behavioral:  Negative for depression.    Physical Exam BP 135/82    Pulse  96    Temp 98.4 F (36.9 C) (Oral)    Ht 5\' 5"  (1.651 m)    Wt 221 lb 6.4 oz (100.4 kg)    SpO2 95%    BMI 36.84 kg/m  CONSTITUTIONAL: No acute distress, well nourished. HEENT:  Normocephalic, atraumatic, extraocular motion intact. NECK: Trachea is midline, and there is no jugular venous distension.  RESPIRATORY:  Lungs are clear, and breath sounds are equal bilaterally. Normal respiratory effort without pathologic use of accessory muscles. CARDIOVASCULAR: Heart is  regular without murmurs, gallops, or rubs. GI: The abdomen is soft, non-distended, with some soreness to palpation in the right upper quadrant.  Negative Murphy's sign.  MUSCULOSKELETAL:  Normal muscle strength and tone in all four extremities.  No peripheral edema or cyanosis. SKIN: Skin turgor is normal. There are no pathologic skin lesions.  NEUROLOGIC:  Motor and sensation is grossly normal.  Cranial nerves are grossly intact. PSYCH:  Alert and oriented to person, place and time. Affect is normal.  Imaging: CT abdomen/pelvis 11/22/20: IMPRESSION: 1. No acute abnormality in the abdomen/pelvis. 2. Gallstones and sludge in the gallbladder without CT findings of acute cholecystitis. 3. Moderate volume of colonic stool, can be seen with constipation. 4. Trace right pleural thickening. Ground-glass opacity in the dependent right lower lobe, may represent pneumonia in the appropriate clinical setting. Possibility of pulmonary infarct is raised, and if there is clinical concern for pulmonary embolus, recommend chest CTA.  Assessment and Plan: This is a 61 y.o. female with symptomatic cholelithiasis  --Discussed with the patient that her symptoms of RUQ pain are consistent with her diagnosis of cholelithiasis.  She does not have other symptoms from it, but reports that her symptoms are getting more frequent.  She is interested in surgery.  Discussed with her the role for a robotic assisted cholecystectomy with ICG cholangiogram, and discussed with her the surgery at length, including risks of bleeding, infection, injury to surrounding structures, post-operative activity restrictions, and she's willing to proceed. -Will schedule her for 12/06/21.  Will send clearance form to Dr. Sherril Cong.  I spent 80 minutes dedicated to the care of this patient on the date of this encounter to include pre-visit review of records, face-to-face time with the patient discussing diagnosis and management, and any  post-visit coordination of care.   Melvyn Neth, [facility]

## 2021-11-28 ENCOUNTER — Telehealth: Payer: Self-pay | Admitting: Surgery

## 2021-11-28 NOTE — Telephone Encounter (Signed)
Outgoing call is made, unable to leave message, voice mailbox not set up.    Please inform patient of the following regarding scheduled surgery.    Pre-Admission date/time, COVID Testing date and Surgery date.  Surgery Date: 12/06/21 Preadmission Testing Date: 11/29/21 (phone 1p-5p) Covid Testing Date: Not needed.     Also patient will need to call at [phone], between 1-3:00pm the day before surgery, to find out what time to arrive for surgery.

## 2021-11-29 ENCOUNTER — Other Ambulatory Visit
Admission: RE | Admit: 2021-11-29 | Discharge: 2021-11-29 | Disposition: A | Discharge status: Home / Self Care | Payer: Medicaid Other | Source: Ambulatory Visit | Attending: Surgery | Admitting: Surgery

## 2021-11-29 ENCOUNTER — Other Ambulatory Visit: Payer: Self-pay

## 2021-11-29 DIAGNOSIS — Z01812 Encounter for preprocedural laboratory examination: Secondary | ICD-10-CM

## 2021-11-29 HISTORY — DX: Pneumonia, unspecified organism: J18.9

## 2021-11-29 HISTORY — DX: Headache, unspecified: R51.9

## 2021-11-29 HISTORY — DX: Unspecified asthma, uncomplicated: J45.909

## 2021-11-29 HISTORY — DX: Malignant (primary) neoplasm, unspecified: C80.1

## 2021-11-29 HISTORY — DX: Unspecified osteoarthritis, unspecified site: M19.90

## 2021-11-29 HISTORY — DX: Anemia, unspecified: D64.9

## 2021-11-29 HISTORY — DX: Dyspnea, unspecified: R06.00

## 2021-11-29 NOTE — Patient Instructions (Addendum)
Your procedure is scheduled on: 12/06/21 - Tuesday Report to the Registration Desk on the 1st floor of the [facility]. To find out your arrival time, please call [phone] between 1PM - 3PM on: 12/05/21 - Monday  REMEMBER: Instructions that are not followed completely may result in serious medical risk, up to and including death; or upon the discretion of your surgeon and anesthesiologist your surgery may need to be rescheduled.  Do not eat food after midnight the night before surgery.  No gum chewing, lozengers or hard candies.  You may however, drink CLEAR liquids up to 2 hours before you are scheduled to arrive for your surgery. Do not drink anything within 2 hours of your scheduled arrival time.  Clear liquids include: - water  - apple juice without pulp - gatorade (not RED colors) - black coffee or tea (Do NOT add milk or creamers to the coffee or tea) Do NOT drink anything that is not on this list.  TAKE THESE MEDICATIONS THE MORNING OF SURGERY WITH A SIP OF WATER:  - albuterol (VENTOLIN HFA) 108 (90 Base) MCG/ACT inhaler - busPIRone (BUSPAR) 10 MG tablet - DULoxetine (CYMBALTA) 60 MG capsule - fluticasone (FLONASE) 50 MCG/ACT nasal spray - oxyCODONE-acetaminophen (PERCOCET/ROXICET) 5-325 MG tablet as needed.  One week prior to surgery: Stop Anti-inflammatories (NSAIDS) such as Advil, Aleve, Ibuprofen, Motrin, Naproxen, Naprosyn and Aspirin based products such as Excedrin, Goodys Powder, BC Powder.  Stop ANY OVER THE COUNTER supplements until after surgery.  You may take Tylenol if needed for pain up until the day of surgery.  No Alcohol for 24 hours before or after surgery.  No Smoking including e-cigarettes for 24 hours prior to surgery.  No chewable tobacco products for at least 6 hours prior to surgery.  No nicotine patches on the day of surgery.  Do not use any "recreational" drugs for at least a week prior to your surgery.  Please be advised that the  combination of cocaine and anesthesia may have negative outcomes, up to and including death. If you test positive for cocaine, your surgery will be cancelled.  On the morning of surgery brush your teeth with toothpaste and water, you may rinse your mouth with mouthwash if you wish. Do not swallow any toothpaste or mouthwash.  Use CHG Soap or wipes as directed on instruction sheet.  Do not wear jewelry, make-up, hairpins, clips or nail polish.  Do not wear lotions, powders, or perfumes.   Do not shave body from the neck down 48 hours prior to surgery just in case you cut yourself which could leave a site for infection.  Also, freshly shaved skin may become irritated if using the CHG soap.  Contact lenses, hearing aids and dentures may not be worn into surgery.  Do not bring valuables to the hospital. [facility] is not responsible for any missing/lost belongings or valuables.   Notify your doctor if there is any change in your medical condition (cold, fever, infection).  Wear comfortable clothing (specific to your surgery type) to the hospital.  After surgery, you can help prevent lung complications by doing breathing exercises.  Take deep breaths and cough every 1-2 hours. Your doctor may order a device called an Incentive Spirometer to help you take deep breaths. When coughing or sneezing, hold a pillow firmly against your incision with both hands. This is called splinting. Doing this helps protect your incision. It also decreases belly discomfort.  If you are being admitted to the hospital  overnight, leave your suitcase in the car. After surgery it may be brought to your room.  If you are being discharged the day of surgery, you will not be allowed to drive home. You will need a responsible adult (18 years or older) to drive you home and stay with you that night.   If you are taking public transportation, you will need to have a responsible adult (18 years or older) with  you. Please confirm with your physician that it is acceptable to use public transportation.   Please call the [facility] at [phone] if you have any questions about these instructions.  Surgery Visitation Policy:  Patients undergoing a surgery or procedure may have one family member or support person with them as long as that person is not COVID-19 positive or experiencing its symptoms.  That person may remain in the waiting area during the procedure and may rotate out with other people.  Inpatient Visitation:    Visiting hours are 7 a.m. to 8 p.m. Up to two visitors ages 16+ are allowed at one time in a patient room. The visitors may rotate out with other people during the day. Visitors must check out when they leave, or other visitors will not be allowed. One designated support person may remain overnight. The visitor must pass COVID-19 screenings, use hand sanitizer when entering and exiting the patients room and wear a mask at all times, including in the patients room. Patients must also wear a mask when staff or their visitor are in the room. Masking is required regardless of vaccination status.

## 2021-11-29 NOTE — Telephone Encounter (Signed)
Outgoing call is made again, patient is now informed of all dates regarding her surgery and verbalized understanding.

## 2021-11-30 NOTE — Progress Notes (Signed)
Medical Clearance received from [facility]. The patient is cleared at Low risk.

## 2021-12-01 ENCOUNTER — Encounter: Payer: Self-pay | Admitting: Urgent Care

## 2021-12-01 ENCOUNTER — Other Ambulatory Visit
Admission: RE | Admit: 2021-12-01 | Discharge: 2021-12-01 | Disposition: A | Discharge status: Home / Self Care | Payer: Medicaid Other | Source: Ambulatory Visit | Attending: Surgery | Admitting: Surgery

## 2021-12-01 ENCOUNTER — Other Ambulatory Visit: Payer: Self-pay

## 2021-12-01 DIAGNOSIS — Z01812 Encounter for preprocedural laboratory examination: Secondary | ICD-10-CM

## 2021-12-01 DIAGNOSIS — Z01818 Encounter for other preprocedural examination: Secondary | ICD-10-CM | POA: Diagnosis present

## 2021-12-01 LAB — CBC
HCT: 39.6 % (ref 36.0–46.0)
Hemoglobin: 12.3 g/dL (ref 12.0–15.0)
MCH: 25.8 pg — ABNORMAL LOW (ref 26.0–34.0)
MCHC: 31.1 g/dL (ref 30.0–36.0)
MCV: 83 fL (ref 80.0–100.0)
Platelets: 263 10*3/uL (ref 150–400)
RBC: 4.77 MIL/uL (ref 3.87–5.11)
RDW: 14.8 % (ref 11.5–15.5)
WBC: 7.3 10*3/uL (ref 4.0–10.5)
nRBC: 0 % (ref 0.0–0.2)

## 2021-12-01 LAB — BASIC METABOLIC PANEL
Anion gap: 6 (ref 5–15)
BUN: 7 mg/dL (ref 6–20)
CO2: 26 mmol/L (ref 22–32)
Calcium: 8.5 mg/dL — ABNORMAL LOW (ref 8.9–10.3)
Chloride: 105 mmol/L (ref 98–111)
Creatinine, Ser: 0.89 mg/dL (ref 0.44–1.00)
GFR, Estimated: >60 mL/min (ref >60)
Glucose, Bld: 105 mg/dL — ABNORMAL HIGH (ref 70–99)
Potassium: 3.4 mmol/L — ABNORMAL LOW (ref 3.5–5.1)
Sodium: 137 mmol/L (ref 135–145)

## 2021-12-02 ENCOUNTER — Other Ambulatory Visit: Payer: Self-pay

## 2021-12-05 ENCOUNTER — Other Ambulatory Visit: Payer: Self-pay

## 2021-12-05 MED ORDER — CHLORHEXIDINE GLUCONATE 0.12 % MT SOLN: 15.0000 mL | Freq: Once | OROMUCOSAL | Status: AC

## 2021-12-05 MED ORDER — ORAL CARE MOUTH RINSE
15.0000 mL | Freq: Once | OROMUCOSAL | Status: AC
Start: 2021-12-05 — End: 2021-12-06

## 2021-12-05 MED ORDER — CHLORHEXIDINE GLUCONATE CLOTH 2 % EX PADS: 6.0000 | MEDICATED_PAD | Freq: Once | CUTANEOUS | Status: DC

## 2021-12-05 MED ORDER — GABAPENTIN 300 MG PO CAPS: 300.0000 mg | ORAL_CAPSULE | ORAL | Status: AC

## 2021-12-05 MED ORDER — BUSPIRONE HCL 5 MG PO TABS
10.0000 mg | ORAL_TABLET | Freq: Two times a day (BID) | ORAL | 3 refills | Status: AC
  Filled 2021-12-05: qty 120, 30d supply, fill #0
  Filled 2021-12-31: qty 120, 30d supply, fill #1
  Filled 2022-02-08: qty 120, 30d supply, fill #2
  Filled 2022-02-27: qty 120, 30d supply, fill #0
  Filled 2022-03-26 – 2022-11-25 (×5): qty 120, 30d supply, fill #1

## 2021-12-05 MED ORDER — FAMOTIDINE 20 MG PO TABS
20.0000 mg | ORAL_TABLET | Freq: Once | ORAL | Status: AC
Start: 2021-12-05 — End: 2021-12-06

## 2021-12-05 MED ORDER — ACETAMINOPHEN 500 MG PO TABS: 1000.0000 mg | ORAL_TABLET | ORAL | Status: AC

## 2021-12-05 MED ORDER — CEFAZOLIN SODIUM-DEXTROSE 2-4 GM/100ML-% IV SOLN
2.0000 g | INTRAVENOUS | Status: AC
  Administered 2021-12-06: 2 g via INTRAVENOUS

## 2021-12-05 MED ORDER — LACTATED RINGERS IV SOLN: INTRAVENOUS | Status: DC

## 2021-12-06 ENCOUNTER — Other Ambulatory Visit: Payer: Self-pay

## 2021-12-06 ENCOUNTER — Encounter
Admission: RE | Disposition: A | Discharge status: Home / Self Care | Payer: Self-pay | Source: Home / Self Care | Attending: Surgery

## 2021-12-06 ENCOUNTER — Ambulatory Visit
Admission: RE | Admit: 2021-12-06 | Discharge: 2021-12-06 | Disposition: A | Discharge status: Home / Self Care | Payer: Medicaid Other | Attending: Surgery | Admitting: Surgery

## 2021-12-06 ENCOUNTER — Ambulatory Visit: Payer: Medicaid Other | Admitting: Anesthesiology

## 2021-12-06 ENCOUNTER — Encounter: Payer: Self-pay | Admitting: Surgery

## 2021-12-06 ENCOUNTER — Ambulatory Visit: Payer: Medicaid Other | Admitting: Urgent Care

## 2021-12-06 DIAGNOSIS — F17210 Nicotine dependence, cigarettes, uncomplicated: Secondary | ICD-10-CM | POA: Diagnosis not present

## 2021-12-06 DIAGNOSIS — K8010 Calculus of gallbladder with chronic cholecystitis without obstruction: Secondary | ICD-10-CM | POA: Diagnosis not present

## 2021-12-06 DIAGNOSIS — K8020 Calculus of gallbladder without cholecystitis without obstruction: Secondary | ICD-10-CM

## 2021-12-06 DIAGNOSIS — Z86711 Personal history of pulmonary embolism: Secondary | ICD-10-CM | POA: Diagnosis not present

## 2021-12-06 DIAGNOSIS — F319 Bipolar disorder, unspecified: Secondary | ICD-10-CM | POA: Diagnosis not present

## 2021-12-06 DIAGNOSIS — F419 Anxiety disorder, unspecified: Secondary | ICD-10-CM | POA: Diagnosis not present

## 2021-12-06 DIAGNOSIS — Z79899 Other long term (current) drug therapy: Secondary | ICD-10-CM | POA: Diagnosis not present

## 2021-12-06 SURGERY — CHOLECYSTECTOMY, ROBOT-ASSISTED, LAPAROSCOPIC
Anesthesia: General

## 2021-12-06 MED ORDER — INDOCYANINE GREEN 25 MG IV SOLR
2.5000 mg | INTRAVENOUS | Status: AC
  Administered 2021-12-06: 2.5 mg via INTRAVENOUS
  Filled 2021-12-06: qty 1

## 2021-12-06 MED ORDER — OXYCODONE HCL 5 MG PO TABS: 5.0000 mg | ORAL_TABLET | ORAL | 0 refills | Status: DC | PRN

## 2021-12-06 MED ORDER — IBUPROFEN 600 MG PO TABS: 600.0000 mg | ORAL_TABLET | Freq: Three times a day (TID) | ORAL | 1 refills | Status: AC | PRN

## 2021-12-06 MED ORDER — FENTANYL CITRATE (PF) 100 MCG/2ML IJ SOLN
INTRAMUSCULAR | Status: DC | PRN
  Administered 2021-12-06 (×3): 50 ug via INTRAVENOUS

## 2021-12-06 MED ORDER — DEXAMETHASONE SODIUM PHOSPHATE 10 MG/ML IJ SOLN
INTRAMUSCULAR | Status: DC | PRN
  Administered 2021-12-06: 8 mg via INTRAVENOUS

## 2021-12-06 MED ORDER — KETOROLAC TROMETHAMINE 30 MG/ML IJ SOLN
INTRAMUSCULAR | Status: DC | PRN
Start: 2021-12-06 — End: 2021-12-06
  Administered 2021-12-06: 15 mg via INTRAVENOUS

## 2021-12-06 MED ORDER — HYDROMORPHONE HCL 1 MG/ML IJ SOLN
INTRAMUSCULAR | Status: AC
  Filled 2021-12-06: qty 1

## 2021-12-06 MED ORDER — ROCURONIUM BROMIDE 100 MG/10ML IV SOLN
INTRAVENOUS | Status: DC | PRN
  Administered 2021-12-06: 50 mg via INTRAVENOUS

## 2021-12-06 MED ORDER — ONDANSETRON HCL 4 MG/2ML IJ SOLN
INTRAMUSCULAR | Status: DC | PRN
  Administered 2021-12-06 (×2): 4 mg via INTRAVENOUS

## 2021-12-06 MED ORDER — GABAPENTIN 300 MG PO CAPS
ORAL_CAPSULE | ORAL | Status: AC
Start: 2021-12-06 — End: 2021-12-06
  Administered 2021-12-06: 300 mg via ORAL
  Filled 2021-12-06: qty 1

## 2021-12-06 MED ORDER — ACETAMINOPHEN 325 MG PO TABS: 650.0000 mg | ORAL_TABLET | Freq: Four times a day (QID) | ORAL | Status: AC | PRN

## 2021-12-06 MED ORDER — MIDAZOLAM HCL 2 MG/2ML IJ SOLN
INTRAMUSCULAR | Status: DC | PRN
  Administered 2021-12-06: 2 mg via INTRAVENOUS

## 2021-12-06 MED ORDER — LIDOCAINE HCL (CARDIAC) PF 100 MG/5ML IV SOSY
PREFILLED_SYRINGE | INTRAVENOUS | Status: DC | PRN
  Administered 2021-12-06: 80 mg via INTRAVENOUS

## 2021-12-06 MED ORDER — PHENYLEPHRINE 40 MCG/ML (10ML) SYRINGE FOR IV PUSH (FOR BLOOD PRESSURE SUPPORT)
PREFILLED_SYRINGE | INTRAVENOUS | Status: DC | PRN
  Administered 2021-12-06: 80 ug via INTRAVENOUS

## 2021-12-06 MED ORDER — PROPOFOL 10 MG/ML IV BOLUS
INTRAVENOUS | Status: DC | PRN
  Administered 2021-12-06: 150 mg via INTRAVENOUS

## 2021-12-06 MED ORDER — SEVOFLURANE IN SOLN
RESPIRATORY_TRACT | Status: AC
  Filled 2021-12-06: qty 250

## 2021-12-06 MED ORDER — ONDANSETRON HCL 4 MG/2ML IJ SOLN: 4.0000 mg | Freq: Once | INTRAMUSCULAR | Status: DC | PRN

## 2021-12-06 MED ORDER — FENTANYL CITRATE (PF) 100 MCG/2ML IJ SOLN
INTRAMUSCULAR | Status: AC
  Filled 2021-12-06: qty 2

## 2021-12-06 MED ORDER — BUPIVACAINE-EPINEPHRINE (PF) 0.25% -1:200000 IJ SOLN
INTRAMUSCULAR | Status: DC | PRN
Start: 2021-12-06 — End: 2021-12-06
  Administered 2021-12-06: 30 mL

## 2021-12-06 MED ORDER — FENTANYL CITRATE (PF) 100 MCG/2ML IJ SOLN
25.0000 ug | INTRAMUSCULAR | Status: DC | PRN
  Administered 2021-12-06: 25 ug via INTRAVENOUS

## 2021-12-06 MED ORDER — CHLORHEXIDINE GLUCONATE 0.12 % MT SOLN
OROMUCOSAL | Status: AC
  Administered 2021-12-06: 15 mL via OROMUCOSAL
  Filled 2021-12-06: qty 15

## 2021-12-06 MED ORDER — BUPIVACAINE-EPINEPHRINE (PF) 0.25% -1:200000 IJ SOLN
INTRAMUSCULAR | Status: AC
  Filled 2021-12-06: qty 30

## 2021-12-06 MED ORDER — FENTANYL CITRATE (PF) 100 MCG/2ML IJ SOLN
INTRAMUSCULAR | Status: AC
  Administered 2021-12-06: 25 ug via INTRAVENOUS
  Filled 2021-12-06: qty 2

## 2021-12-06 MED ORDER — CEFAZOLIN SODIUM-DEXTROSE 2-4 GM/100ML-% IV SOLN
INTRAVENOUS | Status: AC
  Filled 2021-12-06: qty 100

## 2021-12-06 MED ORDER — ACETAMINOPHEN 500 MG PO TABS
ORAL_TABLET | ORAL | Status: AC
  Administered 2021-12-06: 1000 mg via ORAL
  Filled 2021-12-06: qty 2

## 2021-12-06 MED ORDER — FAMOTIDINE 20 MG PO TABS
ORAL_TABLET | ORAL | Status: AC
Start: 2021-12-06 — End: 2021-12-06
  Administered 2021-12-06: 20 mg via ORAL
  Filled 2021-12-06: qty 1

## 2021-12-06 MED ORDER — MIDAZOLAM HCL 2 MG/2ML IJ SOLN
INTRAMUSCULAR | Status: AC
  Filled 2021-12-06: qty 2

## 2021-12-06 SURGICAL SUPPLY — 56 items
ADH SKN CLS APL DERMABOND .7 (GAUZE/BANDAGES/DRESSINGS) ×1
BAG INFUSER PRESSURE 100CC (MISCELLANEOUS) IMPLANT
BAG SPEC RTRVL LRG 6X4 10 (ENDOMECHANICALS) ×1
CANNULA REDUC XI 12-8 STAPL (CANNULA) ×2
CANNULA REDUCER 12-8 DVNC XI (CANNULA) ×1 IMPLANT
CLIP LIGATING HEMO O LOK GREEN (MISCELLANEOUS) ×2 IMPLANT
CUP MEDICINE 2OZ PLAST GRAD ST (MISCELLANEOUS) ×1 IMPLANT
DERMABOND ADVANCED (GAUZE/BANDAGES/DRESSINGS) ×1
DERMABOND ADVANCED .7 DNX12 (GAUZE/BANDAGES/DRESSINGS) ×1 IMPLANT
DRAPE ARM DVNC X/XI (DISPOSABLE) ×4 IMPLANT
DRAPE COLUMN DVNC XI (DISPOSABLE) ×1 IMPLANT
DRAPE DA VINCI XI ARM (DISPOSABLE) ×8
DRAPE DA VINCI XI COLUMN (DISPOSABLE) ×2
ELECT CAUTERY BLADE TIP 2.5 (TIP) ×2
ELECT REM PT RETURN 9FT ADLT (ELECTROSURGICAL) ×2
ELECTRODE CAUTERY BLDE TIP 2.5 (TIP) ×1 IMPLANT
ELECTRODE REM PT RTRN 9FT ADLT (ELECTROSURGICAL) ×1 IMPLANT
GLOVE SURG SYN 7.0 (GLOVE) ×6 IMPLANT
GLOVE SURG SYN 7.0 PF PI (GLOVE) ×2 IMPLANT
GLOVE SURG SYN 7.5  E (GLOVE) ×6
GLOVE SURG SYN 7.5 E (GLOVE) ×3 IMPLANT
GLOVE SURG SYN 7.5 PF PI (GLOVE) ×2 IMPLANT
GOWN STRL REUS W/ TWL LRG LVL3 (GOWN DISPOSABLE) ×4 IMPLANT
GOWN STRL REUS W/TWL LRG LVL3 (GOWN DISPOSABLE) ×8
IRRIGATOR SUCT 8 DISP DVNC XI (IRRIGATION / IRRIGATOR) IMPLANT
IRRIGATOR SUCTION 8MM XI DISP (IRRIGATION / IRRIGATOR)
IV NS 1000ML (IV SOLUTION)
IV NS 1000ML BAXH (IV SOLUTION) IMPLANT
KIT PINK PAD W/HEAD ARE REST (MISCELLANEOUS) ×2
KIT PINK PAD W/HEAD ARM REST (MISCELLANEOUS) ×1 IMPLANT
LABEL OR SOLS (LABEL) ×2 IMPLANT
MANIFOLD NEPTUNE II (INSTRUMENTS) ×2 IMPLANT
NEEDLE HYPO 22GX1.5 SAFETY (NEEDLE) ×2 IMPLANT
NS IRRIG 500ML POUR BTL (IV SOLUTION) ×2 IMPLANT
OBTURATOR OPTICAL STANDARD 8MM (TROCAR) ×2
OBTURATOR OPTICAL STND 8 DVNC (TROCAR) ×1
OBTURATOR OPTICALSTD 8 DVNC (TROCAR) ×1 IMPLANT
PACK LAP CHOLECYSTECTOMY (MISCELLANEOUS) ×2 IMPLANT
PENCIL ELECTRO HAND CTR (MISCELLANEOUS) ×2 IMPLANT
POUCH SPECIMEN RETRIEVAL 10MM (ENDOMECHANICALS) ×2 IMPLANT
SEAL CANN UNIV 5-8 DVNC XI (MISCELLANEOUS) ×3 IMPLANT
SEAL XI 5MM-8MM UNIVERSAL (MISCELLANEOUS) ×6
SET TUBE SMOKE EVAC HIGH FLOW (TUBING) ×2 IMPLANT
SOLUTION ELECTROLUBE (MISCELLANEOUS) ×2 IMPLANT
SPIKE FLUID TRANSFER (MISCELLANEOUS) ×2 IMPLANT
SPONGE T-LAP 18X18 ~~LOC~~+RFID (SPONGE) IMPLANT
SPONGE T-LAP 4X18 ~~LOC~~+RFID (SPONGE) ×1 IMPLANT
STAPLER CANNULA SEAL DVNC XI (STAPLE) ×1 IMPLANT
STAPLER CANNULA SEAL XI (STAPLE) ×2
SUT MNCRL AB 4-0 PS2 18 (SUTURE) ×2 IMPLANT
SUT VIC AB 3-0 SH 27 (SUTURE)
SUT VIC AB 3-0 SH 27X BRD (SUTURE) IMPLANT
SUT VICRYL 0 AB UR-6 (SUTURE) ×4 IMPLANT
TAPE TRANSPORE STRL 2 31045 (GAUZE/BANDAGES/DRESSINGS) ×2 IMPLANT
TROCAR BALLN GELPORT 12X130M (ENDOMECHANICALS) ×2 IMPLANT
WATER STERILE IRR 500ML POUR (IV SOLUTION) ×1 IMPLANT

## 2021-12-06 NOTE — Anesthesia Preprocedure Evaluation (Signed)
Anesthesia Evaluation  Patient identified by MRN, date of birth, ID band Patient awake    Reviewed: Allergy & Precautions, H&P , NPO status , Patient's Chart, lab work & pertinent test results, reviewed documented beta blocker date and time   History of Anesthesia Complications Negative for: history of anesthetic complications  Airway Mallampati: II  TM Distance: >3 FB Neck ROM: full    Dental  (+) Dental Advidsory Given, Caps, Missing, Poor Dentition   Pulmonary shortness of breath and with exertion, asthma , neg COPD, neg recent URI, Current Smoker and Patient abstained from smoking.,    Pulmonary exam normal breath sounds clear to auscultation       Cardiovascular Exercise Tolerance: Good negative cardio ROS Normal cardiovascular exam Rhythm:regular Rate:Normal     Neuro/Psych PSYCHIATRIC DISORDERS Anxiety Bipolar Disorder negative neurological ROS     GI/Hepatic negative GI ROS, Neg liver ROS,   Endo/Other  negative endocrine ROS  Renal/GU negative Renal ROS  negative genitourinary   Musculoskeletal   Abdominal   Peds  Hematology negative hematology ROS (+)   Anesthesia Other Findings Past Medical History: No date: Anemia No date: Anxiety No date: Arthritis No date: Asthma No date: Bipolar disorder (HCC) No date: Cancer ([place])     Comment:  skin cancer No date: Dyspnea No date: Headache No date: Pneumonia No date: PTSD (post-traumatic stress disorder) No date: Vertigo   Reproductive/Obstetrics negative OB ROS                             Anesthesia Physical Anesthesia Plan  ASA: 2  Anesthesia Plan: General   Post-op Pain Management:    Induction: Intravenous  PONV Risk Score and Plan: 2 and Ondansetron, Dexamethasone, Midazolam and Treatment may vary due to age or medical condition  Airway Management Planned: Oral ETT  Additional Equipment:   Intra-op Plan:    Post-operative Plan: Extubation in OR  Informed Consent: I have reviewed the patients History and Physical, chart, labs and discussed the procedure including the risks, benefits and alternatives for the proposed anesthesia with the patient or authorized representative who has indicated his/her understanding and acceptance.     Dental Advisory Given  Plan Discussed with: Anesthesiologist, CRNA and Surgeon  Anesthesia Plan Comments:         Anesthesia Quick Evaluation

## 2021-12-06 NOTE — Anesthesia Procedure Notes (Signed)
Procedure Name: Intubation Date/Time: 12/06/2021 11:18 AM Performed by: Hedda Slade, CRNA Pre-anesthesia Checklist: Patient identified, Patient being monitored, Timeout performed, Emergency Drugs available and Suction available Patient Re-evaluated:Patient Re-evaluated prior to induction Oxygen Delivery Method: Circle system utilized Preoxygenation: Pre-oxygenation with 100% oxygen Induction Type: IV induction Ventilation: Mask ventilation without difficulty Laryngoscope Size: 3 and McGraph Grade View: Grade I Tube type: Oral Tube size: 7.0 mm Number of attempts: 1 Airway Equipment and Method: Stylet Placement Confirmation: ETT inserted through vocal cords under direct vision, positive ETCO2 and breath sounds checked- equal and bilateral Secured at: 20 cm Tube secured with: Tape Dental Injury: Teeth and Oropharynx as per pre-operative assessment

## 2021-12-06 NOTE — Interval H&P Note (Signed)
History and Physical Interval Note:  12/06/2021 10:48 AM  Catherine Pollard  has presented today for surgery, with the diagnosis of cholelithiasis.  The various methods of treatment have been discussed with the patient and family. After consideration of risks, benefits and other options for treatment, the patient has consented to  Procedure(s): XI ROBOTIC ASSISTED LAPAROSCOPIC CHOLECYSTECTOMY (N/A) [place] (ICG) (N/A) as a surgical intervention.  The patient's history has been reviewed, patient examined, no change in status, stable for surgery.  I have reviewed the patient's chart and labs.  Questions were answered to the patient's satisfaction.     Aubrina Nieman

## 2021-12-06 NOTE — Anesthesia Postprocedure Evaluation (Signed)
Anesthesia Post Note  Patient: Felix Pratt Carden Barrett  Procedure(s) Performed: XI ROBOTIC ASSISTED LAPAROSCOPIC CHOLECYSTECTOMY INDOCYANINE GREEN FLUORESCENCE IMAGING (ICG)  Patient location during evaluation: PACU Anesthesia Type: General Level of consciousness: awake and alert Pain management: pain level controlled Vital Signs Assessment: post-procedure vital signs reviewed and stable Respiratory status: spontaneous breathing, nonlabored ventilation, respiratory function stable and patient connected to nasal cannula oxygen Cardiovascular status: blood pressure returned to baseline and stable Postop Assessment: no apparent nausea or vomiting Anesthetic complications: no   No notable events documented.   Last Vitals:  Vitals:   12/06/21 1345 12/06/21 1400  BP: 129/88 111/81  Pulse: 92 88  Resp: 17 13  Temp:    SpO2: 93% 94%    Last Pain:  Vitals:   12/06/21 1400  TempSrc:   PainSc: 4                  Martha Clan

## 2021-12-06 NOTE — Discharge Instructions (Signed)
AMBULATORY SURGERY  ?DISCHARGE INSTRUCTIONS ? ? ?The drugs that you were given will stay in your system until tomorrow so for the next 24 hours you should not: ? ?Drive an automobile ?Make any legal decisions ?Drink any alcoholic beverage ? ? ?You may resume regular meals tomorrow.  Today it is better to start with liquids and gradually work up to solid foods. ? ?You may eat anything you prefer, but it is better to start with liquids, then soup and crackers, and gradually work up to solid foods. ? ? ?Please notify your doctor immediately if you have any unusual bleeding, trouble breathing, redness and pain at the surgery site, drainage, fever, or pain not relieved by medication. ? ? ? ?Additional Instructions: ? ? ? ?Please contact your physician with any problems or Same Day Surgery at [phone], Monday through Friday 6 am to 4 pm, or [place] [facility] number at [phone].  ?

## 2021-12-06 NOTE — Op Note (Signed)
°  Procedure Date:  12/06/2021  Pre-operative Diagnosis:  Symptomatic cholelithiasis  Post-operative Diagnosis: Symptomatic cholelithiasis  Procedure:  Robotic assisted cholecystectomy with ICG FireFly cholangiogram  Surgeon:  Melvyn Neth, MD  Anesthesia:  General endotracheal  Estimated Blood Loss:  10 ml  Specimens:  gallbladder  Complications:  None  Indications for Procedure:  This is a 61 y.o. female who presents with abdominal pain and workup revealing symptomatic cholelithiasis.  The benefits, complications, treatment options, and expected outcomes were discussed with the patient. The risks of bleeding, infection, recurrence of symptoms, failure to resolve symptoms, bile duct damage, bile duct leak, retained common bile duct stone, bowel injury, and need for further procedures were all discussed with the patient and he was willing to proceed.  Description of Procedure: The patient was correctly identified in the preoperative area and brought into the operating room.  The patient was placed supine with VTE prophylaxis in place.  Appropriate time-outs were performed.  Anesthesia was induced and the patient was intubated.  Appropriate antibiotics were infused.  The abdomen was prepped and draped in a sterile fashion. An infraumbilical incision was made. A cutdown technique was used to enter the abdominal cavity without injury, and a 12 mm robotic port was inserted.  Pneumoperitoneum was obtained with appropriate opening pressures.  Three 8-mm ports were placed in the mid abdomen at the level of the umbilicus under direct visualization.  The DaVinci platform was docked, camera targeted, and instruments were placed under direct visualization.  The gallbladder was identified.  The fundus was grasped and retracted cephalad.  Adhesions were lysed bluntly and with electrocautery. The infundibulum was grasped and retracted laterally, exposing the peritoneum overlying the gallbladder.  This  was incised with electrocautery and extended on either side of the gallbladder.  FireFly cholangiogram was then obtained, and we were able to clearly identify the cystic duct and common bile duct.  The cystic duct and cystic artery were carefully dissected with combination of cautery and blunt dissection.  Both were clipped twice proximally and once distally, cutting in between.  The gallbladder was taken from the gallbladder fossa in a retrograde fashion with electrocautery. The gallbladder was placed in an Endocatch bag. The liver bed was inspected and any bleeding was controlled with electrocautery. The right upper quadrant was then inspected again revealing intact clips, no bleeding, and no ductal injury.  The 8 mm ports were removed under direct visualization and the 12 mm port was removed.  The Endocatch bag was brought out via the umbilical incision. The fascial opening was closed using 0 vicryl suture.  Local anesthetic was infused in all incisions and the incisions were closed with 4-0 Monocryl.  The wounds were cleaned and sealed with DermaBond.  The patient was emerged from anesthesia and extubated and brought to the recovery room for further management.  The patient tolerated the procedure well and all counts were correct at the end of the case.   Melvyn Neth, MD

## 2021-12-06 NOTE — Transfer of Care (Signed)
Immediate Anesthesia Transfer of Care Note  Patient: Catherine Pollard  Procedure(s) Performed: XI ROBOTIC ASSISTED LAPAROSCOPIC CHOLECYSTECTOMY INDOCYANINE GREEN FLUORESCENCE IMAGING (ICG)  Patient Location: PACU  Anesthesia Type:General  Level of Consciousness: awake and sedated  Airway & Oxygen Therapy: Patient Spontanous Breathing and Patient connected to face mask oxygen  Post-op Assessment: Report given to RN and Post -op Vital signs reviewed and stable  Post vital signs: Reviewed and stable  Last Vitals:  Vitals Value Taken Time  BP 119/80 12/06/21 1300  Temp    Pulse 92 12/06/21 1304  Resp 16 12/06/21 1304  SpO2 98 % 12/06/21 1304  Vitals shown include unvalidated device data.  Last Pain:  Vitals:   12/06/21 0950  TempSrc: Oral  PainSc: 0-No pain         Complications: No notable events documented.

## 2021-12-07 ENCOUNTER — Encounter: Payer: Self-pay | Admitting: Surgery

## 2021-12-07 LAB — SURGICAL PATHOLOGY

## 2021-12-19 ENCOUNTER — Telehealth: Payer: Self-pay | Admitting: *Deleted

## 2021-12-19 ENCOUNTER — Other Ambulatory Visit: Payer: Self-pay | Admitting: Surgery

## 2021-12-19 ENCOUNTER — Other Ambulatory Visit: Payer: Self-pay

## 2021-12-19 MED ORDER — OXYCODONE HCL 5 MG PO TABS: 5.0000 mg | ORAL_TABLET | ORAL | 0 refills | Status: AC | PRN

## 2021-12-19 NOTE — Telephone Encounter (Signed)
Patient called and wanted to see about getting another refill on oxycodone. She uses [facility] on [address]. ? ?Surgery on 12/06/21 Dr Hampton Abbot gallbladder  ?

## 2021-12-19 NOTE — Telephone Encounter (Signed)
Patient notified that her oxycodone has been refilled, but will not be refilled again. She may augment with Ibuprofen or Tylenol. She is scheduled to follow up this Friday.  ?

## 2021-12-21 ENCOUNTER — Encounter: Payer: Medicaid Other | Admitting: Surgery

## 2021-12-23 ENCOUNTER — Ambulatory Visit (INDEPENDENT_AMBULATORY_CARE_PROVIDER_SITE_OTHER): Payer: Medicaid Other | Admitting: Surgery

## 2021-12-23 ENCOUNTER — Encounter: Payer: Self-pay | Admitting: Surgery

## 2021-12-23 ENCOUNTER — Other Ambulatory Visit: Payer: Self-pay

## 2021-12-23 VITALS — BP 142/82 | HR 89 | Temp 98.7°F | Wt 223.4 lb

## 2021-12-23 DIAGNOSIS — Z09 Encounter for follow-up examination after completed treatment for conditions other than malignant neoplasm: Secondary | ICD-10-CM

## 2021-12-23 DIAGNOSIS — K8020 Calculus of gallbladder without cholecystitis without obstruction: Secondary | ICD-10-CM

## 2021-12-23 NOTE — Patient Instructions (Signed)
If you have any concerns or questions, please feel free to call our office. Follow up as needed. ? ? ?GENERAL POST-OPERATIVE ?PATIENT INSTRUCTIONS  ? ?WOUND CARE INSTRUCTIONS:  Keep a dry clean dressing on the wound if there is drainage. The initial bandage may be removed after 24 hours.  Once the wound has quit draining you may leave it open to air.  If clothing rubs against the wound or causes irritation and the wound is not draining you may cover it with a dry dressing during the daytime.  Try to keep the wound dry and avoid ointments on the wound unless directed to do so.  If the wound becomes bright red and painful or starts to drain infected material that is not clear, please contact your physician immediately.  If the wound is mildly pink and has a thick firm ridge underneath it, this is normal, and is referred to as a healing ridge.  This will resolve over the next 4-6 weeks. ? ?BATHING: ?You may shower if you have been informed of this by your surgeon. However, Please do not submerge in a tub, hot tub, or pool until incisions are completely sealed or have been told by your surgeon that you may do so. ? ?DIET:  You may eat any foods that you can tolerate.  It is a good idea to eat a high fiber diet and take in plenty of fluids to prevent constipation.  If you do become constipated you may want to take a mild laxative or take ducolax tablets on a daily basis until your bowel habits are regular.  Constipation can be very uncomfortable, along with straining, after recent surgery. ? ?ACTIVITY:  You are encouraged to cough and deep breath or use your incentive spirometer if you were given one, every 15-30 minutes when awake.  This will help prevent respiratory complications and low grade fevers post-operatively if you had a general anesthetic.  You may want to hug a pillow when coughing and sneezing to add additional support to the surgical area, if you had abdominal or chest surgery, which will decrease pain  during these times.  You are encouraged to walk and engage in light activity for the next two weeks.  You should not lift, push or pull more than 15-20 pounds, until 01/03/2022 as it could put you at increased risk for complications.  Twenty pounds is roughly equivalent to a plastic bag of groceries. At that time- Listen to your body when lifting, if you have pain when lifting, stop and then try again in a few days. Soreness after doing exercises or activities of daily living is normal as you get back in to your normal routine. ? ?MEDICATIONS:  Try to take narcotic medications and anti-inflammatory medications, such as tylenol, ibuprofen, naprosyn, etc., with food.  This will minimize stomach upset from the medication.  Should you develop nausea and vomiting from the pain medication, or develop a rash, please discontinue the medication and contact your physician.  You should not drive, make important decisions, or operate machinery when taking narcotic pain medication. ? ?SUNBLOCK ?Use sun block to incision area over the next year if this area will be exposed to sun. This helps decrease scarring and will allow you avoid a permanent darkened area over your incision. ? ?QUESTIONS:  Please feel free to call our office if you have any questions, and we will be glad to assist you. ? ? ?Gallbladder Eating Plan ? ?If you have a gallbladder condition, you  may have trouble digesting fats. Eating a low-fat diet can help reduce your symptoms, and may be helpful before and after having surgery to remove your gallbladder (cholecystectomy). Your health care provider may recommend that you work with a diet and nutrition specialist (dietitian) to help you reduce the amount of fat in your diet. ?What are tips for following this plan? ?General guidelines ?Limit your fat intake to less than 30% of your total daily calories. If you eat around 1,800 calories each day, this is less than 60 grams (g) of fat per day. ?Fat is an important  part of a healthy diet. Eating a low-fat diet can make it hard to maintain a healthy body weight. Ask your dietitian how much fat, calories, and other nutrients you need each day. ?Eat small, frequent meals throughout the day instead of three large meals. ?Drink at least 8-10 cups of fluid a day. Drink enough fluid to keep your urine clear or pale yellow. ?Limit alcohol intake to no more than 1 drink a day for nonpregnant women and 2 drinks a day for men. One drink equals 12 oz of beer, 5 oz of wine, or 1? oz of hard liquor. ?Reading food labels ? ?Check Nutrition Facts on food labels for the amount of fat per serving. Choose foods with less than 3 grams of fat per serving. ?Shopping ?Choose nonfat and low-fat healthy foods. Look for the words "nonfat," "low fat," or "fat free." ?Avoid buying processed or prepackaged foods. ?Cooking ?Cook using low-fat methods, such as baking, broiling, grilling, or boiling. ?Cook with small amounts of healthy fats, such as olive oil, grapeseed oil, canola oil, or sunflower oil. ?What foods are recommended? ?All fresh, frozen, or canned fruits and vegetables. ?Whole grains. ?Low-fat or non-fat (skim) milk and yogurt. ?Lean meat, skinless poultry, fish, eggs, and beans. ?Low-fat protein supplement powders or drinks. ?Spices and herbs. ?What foods are not recommended? ?High-fat foods. These include baked goods, fast food, fatty cuts of meat, ice cream, french toast, sweet rolls, pizza, cheese bread, foods covered with butter, creamy sauces, or cheese. ?Fried foods. These include french fries, tempura, battered fish, breaded chicken, fried breads, and sweets. ?Foods with strong odors. ?Foods that cause bloating and gas. ?Summary ?A low-fat diet can be helpful if you have a gallbladder condition, or before and after gallbladder surgery. ?Limit your fat intake to less than 30% of your total daily calories. This is about 60 g of fat if you eat 1,800 calories each day. ?Eat small, frequent  meals throughout the day instead of three large meals. ?This information is not intended to replace advice given to you by your health care provider. Make sure you discuss any questions you have with your health care provider. ?Document Revised: 05/07/2020 Document Reviewed: 05/13/2020 ?Elsevier Patient Education ? 2022 [place]. ? ? ?

## 2021-12-23 NOTE — Progress Notes (Signed)
12/23/2021 ? ?HPI: ?Catherine Pollard is a 61 y.o. female s/p robotic assisted cholecystectomy on 12/06/21.  Patient presents for follow up.  Had a refill of her oxycodone on 12/19/21.  Today she reports that she's doing much better.  Reports some soreness in RUQ and umbilical incision, but otherwise denies any significant pain.  Tolerating diet well without any issues.  She did have one episode of diarrhea this week after consuming a greasy meal, although she has had other greasy foods already without problems. ? ?Vital signs: ?BP (!) 142/82   Pulse 89   Temp 98.7 ?F (37.1 ?C) (Oral)   Wt 223 lb 6.4 oz (101.3 kg)   SpO2 96%   BMI 37.18 kg/m?   ? ?Physical Exam: ?Constitutional:  No acute distress ?Abdomen:  soft, non-distended, with some soreness to palpation at the umbilical incision and RUQ.  No peritonitis, no significant tenderness.  The umbilical incision has a 1 mm separation at the skin edges with a small scab that was debrided sharply with scissors.  No evidence of infection.  The other incisions are healing well. ? ?Assessment/Plan: ?This is a 61 y.o. female s/p robotic assisted cholecystectomy. ? ?--Discussed with the patient that it is not uncommon to have soreness at the incisions, particularly the umbilical incision as it's the biggest and has deeper sutures.  Also the same in RUQ since that was the surgical site and there could be some post-surgical inflammation/scarring.  This should all improve with time.  Discussed the small opening at the skin edges and cleaned the incision sharply.  May apply gauze dressing or leave open. ?--Follow up as needed. ? ? ?Melvyn Neth, MD ?[facility]  ?

## 2021-12-31 ENCOUNTER — Other Ambulatory Visit: Payer: Self-pay

## 2022-01-02 ENCOUNTER — Other Ambulatory Visit (INDEPENDENT_AMBULATORY_CARE_PROVIDER_SITE_OTHER): Payer: Self-pay | Admitting: Surgery

## 2022-01-02 ENCOUNTER — Other Ambulatory Visit: Payer: Self-pay

## 2022-01-05 ENCOUNTER — Other Ambulatory Visit: Payer: Self-pay

## 2022-01-06 ENCOUNTER — Other Ambulatory Visit: Payer: Self-pay

## 2022-01-09 ENCOUNTER — Other Ambulatory Visit: Payer: Self-pay

## 2022-01-10 ENCOUNTER — Other Ambulatory Visit: Payer: Self-pay

## 2022-01-11 ENCOUNTER — Other Ambulatory Visit: Payer: Self-pay

## 2022-01-12 ENCOUNTER — Other Ambulatory Visit: Payer: Self-pay

## 2022-01-18 ENCOUNTER — Other Ambulatory Visit: Payer: Self-pay

## 2022-01-24 ENCOUNTER — Other Ambulatory Visit: Payer: Self-pay

## 2022-01-26 ENCOUNTER — Other Ambulatory Visit: Payer: Self-pay

## 2022-02-02 ENCOUNTER — Other Ambulatory Visit: Payer: Self-pay

## 2022-02-08 ENCOUNTER — Other Ambulatory Visit: Payer: Self-pay | Admitting: Surgery

## 2022-02-08 ENCOUNTER — Other Ambulatory Visit (INDEPENDENT_AMBULATORY_CARE_PROVIDER_SITE_OTHER): Payer: Self-pay | Admitting: Surgery

## 2022-02-08 ENCOUNTER — Other Ambulatory Visit: Payer: Self-pay

## 2022-02-09 ENCOUNTER — Other Ambulatory Visit: Payer: Self-pay

## 2022-02-10 ENCOUNTER — Other Ambulatory Visit: Payer: Self-pay

## 2022-02-15 ENCOUNTER — Other Ambulatory Visit: Payer: Self-pay

## 2022-02-16 ENCOUNTER — Other Ambulatory Visit: Payer: Self-pay

## 2022-02-20 ENCOUNTER — Other Ambulatory Visit: Payer: Self-pay

## 2022-02-22 ENCOUNTER — Other Ambulatory Visit: Payer: Self-pay

## 2022-02-22 ENCOUNTER — Other Ambulatory Visit: Payer: Self-pay | Admitting: Surgery

## 2022-02-22 MED ORDER — CYCLOBENZAPRINE HCL 5 MG PO TABS
ORAL_TABLET | ORAL | 4 refills | Status: AC
  Filled 2022-02-22 – 2022-03-26 (×2): qty 90, fill #0
  Filled 2022-03-28: qty 30, 30d supply, fill #0
  Filled 2022-03-28: qty 90, fill #0
  Filled 2022-03-28 – 2022-11-25 (×4): qty 30, 30d supply, fill #0

## 2022-02-23 ENCOUNTER — Other Ambulatory Visit: Payer: Self-pay

## 2022-02-24 ENCOUNTER — Other Ambulatory Visit: Payer: Self-pay

## 2022-02-27 ENCOUNTER — Other Ambulatory Visit: Payer: Self-pay

## 2022-02-28 ENCOUNTER — Other Ambulatory Visit: Payer: Self-pay

## 2022-03-01 ENCOUNTER — Other Ambulatory Visit: Payer: Self-pay

## 2022-03-27 ENCOUNTER — Other Ambulatory Visit (HOSPITAL_COMMUNITY): Payer: Self-pay

## 2022-03-28 ENCOUNTER — Other Ambulatory Visit: Payer: Self-pay

## 2022-03-28 ENCOUNTER — Other Ambulatory Visit (HOSPITAL_COMMUNITY): Payer: Self-pay

## 2022-04-06 ENCOUNTER — Other Ambulatory Visit: Payer: Self-pay

## 2022-04-21 ENCOUNTER — Other Ambulatory Visit: Payer: Self-pay

## 2022-05-03 ENCOUNTER — Other Ambulatory Visit: Payer: Self-pay

## 2022-06-02 IMAGING — CT CT ANGIO CHEST
2 of 6 series · 18 of 46 positions shown · IV contrast (APPLIED)
Comparison: Chest radiograph and abdominal CT yesterday.

CLINICAL DATA: PE suspected, high prob COVID positive

Right basilar opacity on recent abdominal CT.
EXAM:
CT ANGIOGRAPHY CHEST WITH CONTRAST
TECHNIQUE: Multidetector CT imaging of the chest was performed using the
standard protocol during bolus administration of intravenous
contrast. Multiplanar CT image reconstructions and MIPs were
obtained to evaluate the vascular anatomy.
CONTRAST:  75mL OMNIPAQUE IOHEXOL 350 MG/ML SOLN

[Series 5: thins · axial · 0.65mm/px · z∈[-558,-340]mm · 15 of 241 slices shown]
[im 11/241  lung]
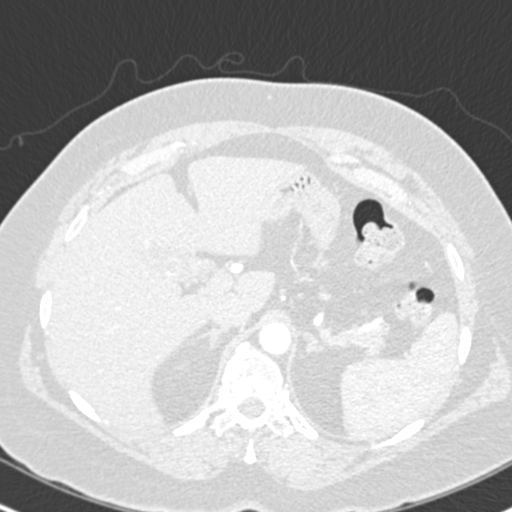
[im 32/241  soft-tissue]
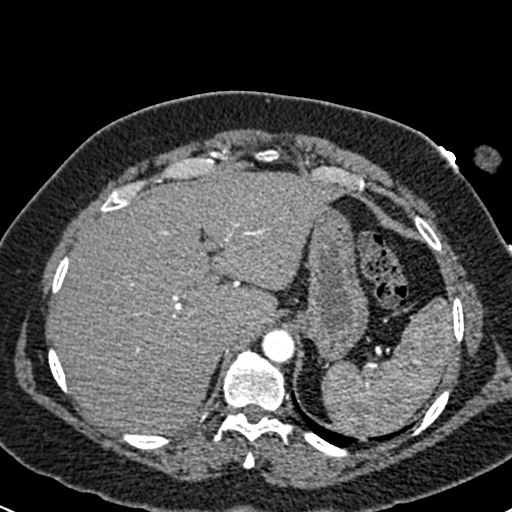
[im 42/241  lung]
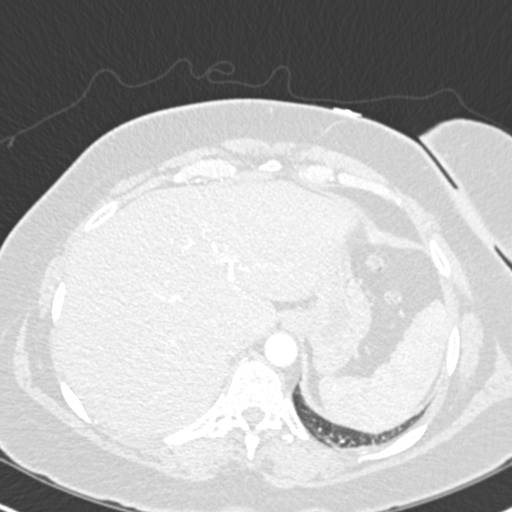
[im 63/241  soft-tissue]
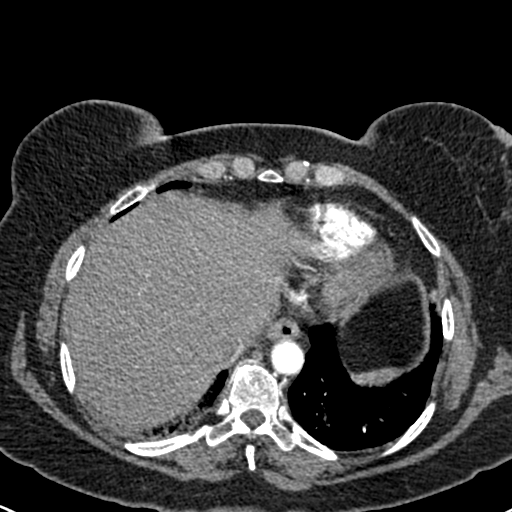
[im 74/241  lung]
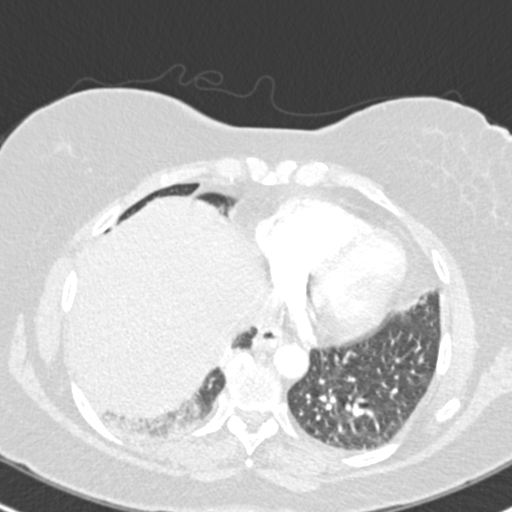
[im 94/241  soft-tissue]
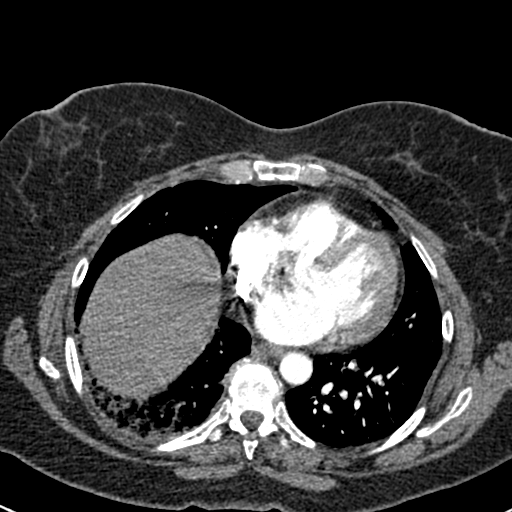
[im 105/241  lung]
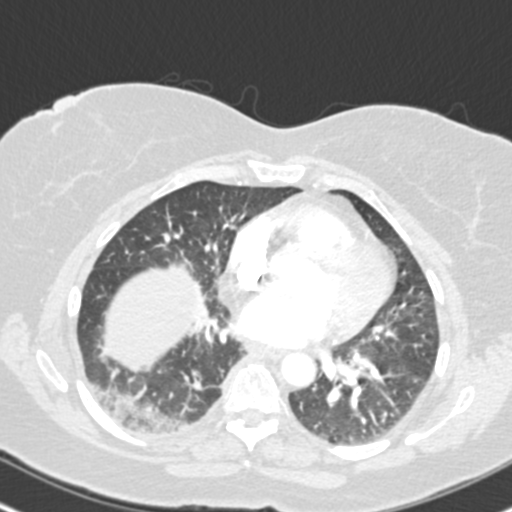
[im 126/241  soft-tissue]
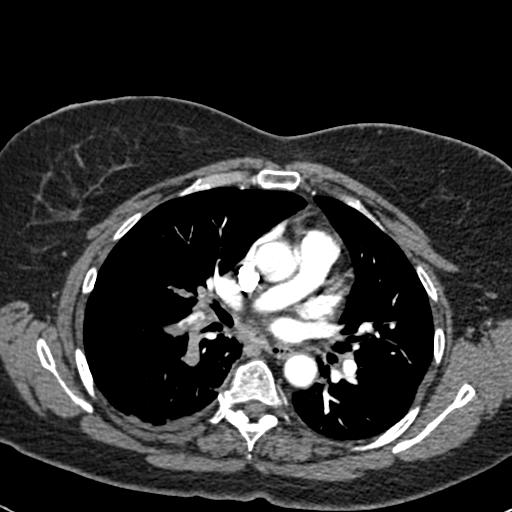
[im 136/241  lung]
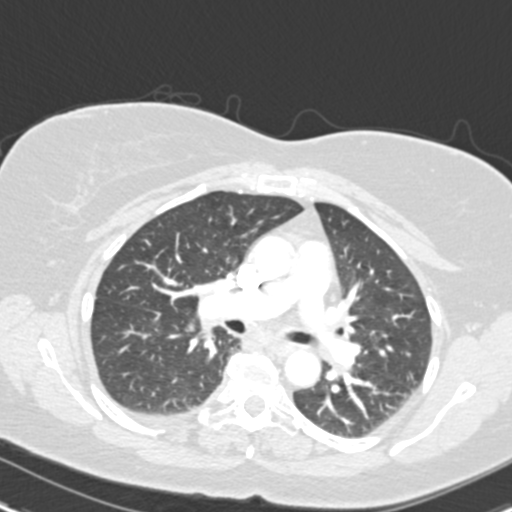
[im 147/241  soft-tissue]
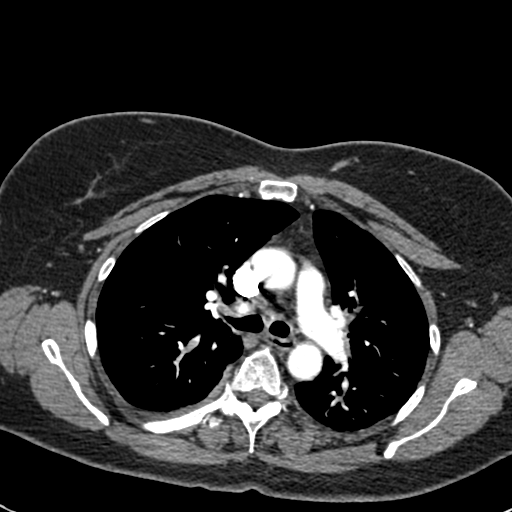
[im 167/241  lung]
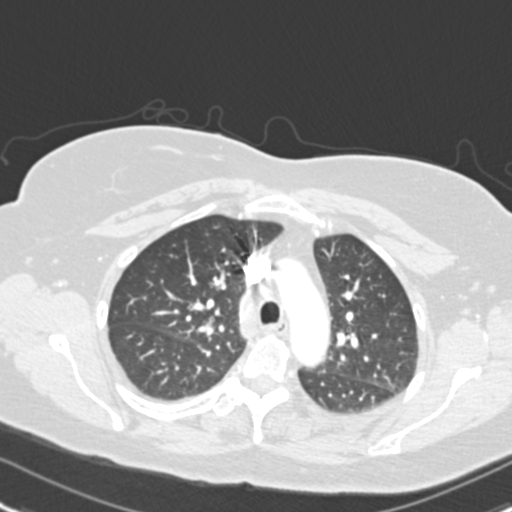
[im 178/241  soft-tissue]
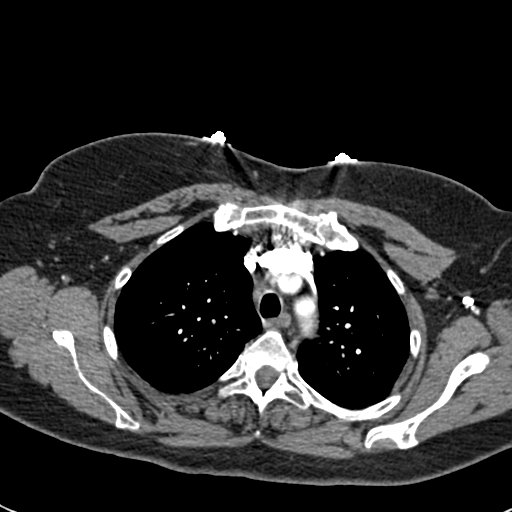
[im 199/241  lung]
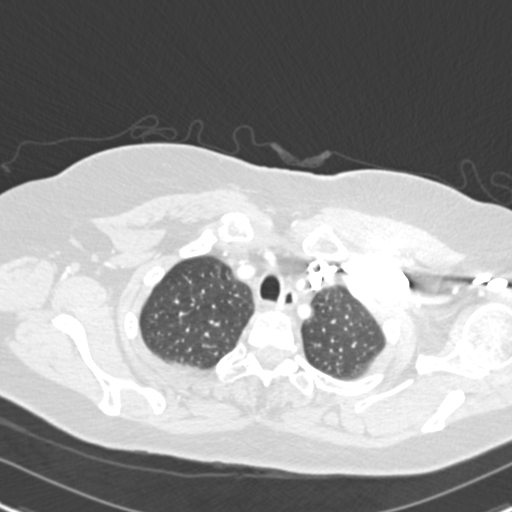
[im 209/241  soft-tissue]
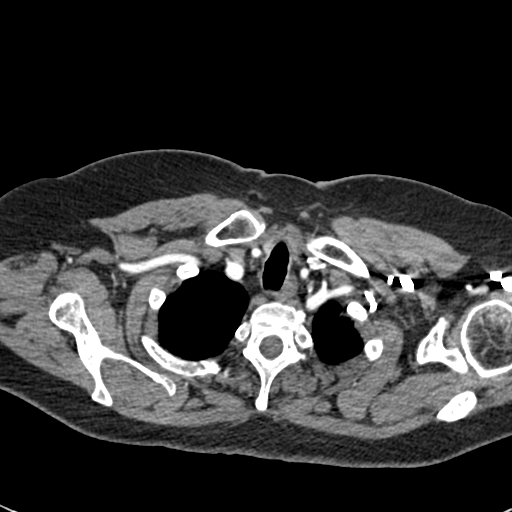
[im 230/241  lung]
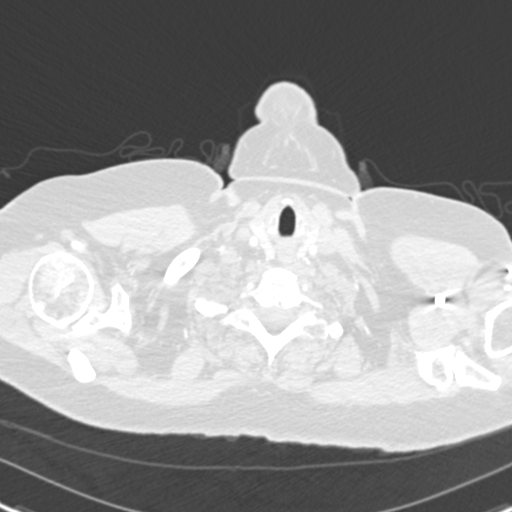

[Series 7: coronal mpr · coronal · 0.49mm/px · 3 of 83 slices shown]
[im 21/83  soft-tissue]
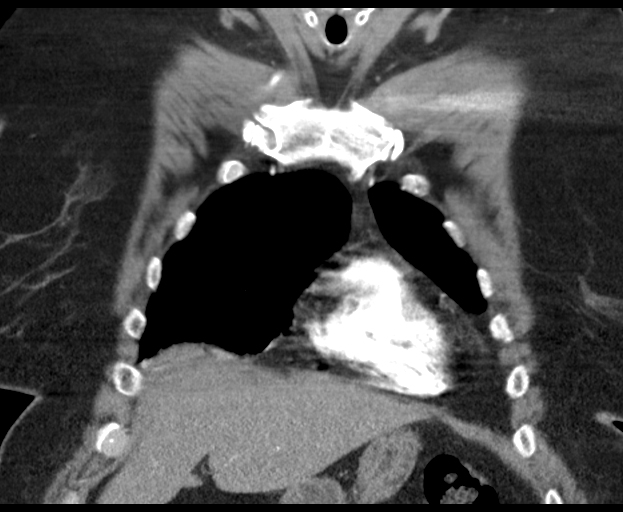
[im 42/83  soft-tissue]
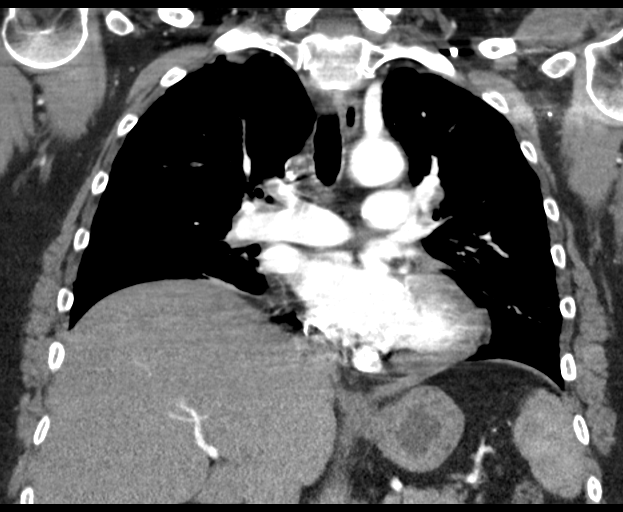
[im 62/83  soft-tissue]
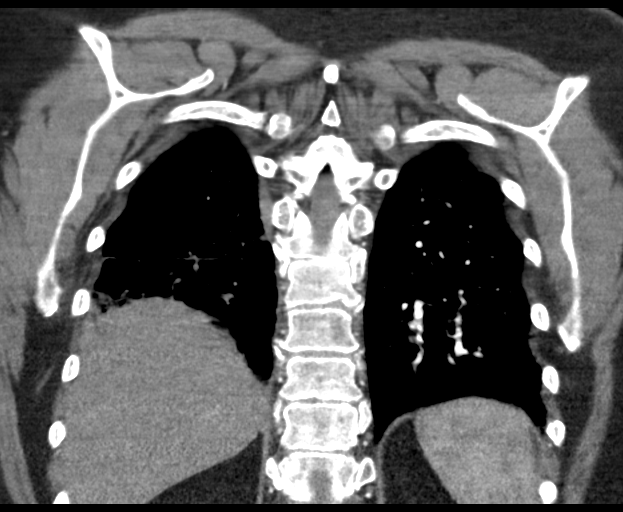

[18 of 46 positions shown; findings below may reference images not displayed]

FINDINGS: Cardiovascular: Positive for acute pulmonary embolus. Filling
defects on the right involves the upper and lower lobar branches, as
well as many segmental and subsegmental branches. Greatest burden in
the right lower lobe where thrombus appears occlusive. There are
subsegmental filling defects in the left upper lobe. Suspect
subsegmental pulmonary emboli in the left lower lobe, partially
obscured by motion. The RV to LV ratio is 1. Thoracic aorta is
normal in caliber without dissection. Common origin of the
brachiocephalic and left common carotid artery, variant arch
anatomy.

Mediastinum/Nodes: Few prominent mediastinal lymph nodes are likely
reactive. No hilar adenopathy. Esophagus and thyroid gland are
unremarkable.

Lungs/Pleura: Ground-glass opacity in the periphery of the right
lower lobe may represent pulmonary infarct. Trace right pleural
effusions/thickening. There is subsegmental opacities in the lingula
and left lower lobe. Trachea and central bronchi are patent.

Upper Abdomen: Assessed on abdominal CT yesterday. No interval
findings.

Musculoskeletal: There are no acute or suspicious osseous
abnormalities.

Review of the MIP images confirms the above findings.
IMPRESSION: 1. Positive for acute pulmonary embolus with moderate clot burden,
primarily in the right, and near occlusive in the right lower lobe.
Additional subsegmental pulmonary emboli in the left upper and lower
lobes. The RV to LV ratio is 1.
2. Ground-glass opacity in the periphery of the right lower lobe
favored to represent pulmonary infarct.
3. Subsegmental opacity in the left lower lobe and lingula,
typically atelectasis.

Critical Value/emergent results were called by telephone at the time
of interpretation on 11/23/2020 at [DATE] to Dr INMER CHAPA , who
verbally acknowledged these results.

## 2022-11-09 ENCOUNTER — Other Ambulatory Visit: Payer: Self-pay | Admitting: Surgery

## 2022-11-09 ENCOUNTER — Other Ambulatory Visit: Payer: Self-pay

## 2022-11-09 ENCOUNTER — Encounter (HOSPITAL_COMMUNITY): Payer: Self-pay

## 2022-11-09 ENCOUNTER — Other Ambulatory Visit (HOSPITAL_COMMUNITY): Payer: Self-pay

## 2022-11-09 MED ORDER — FLUTICASONE PROPIONATE 50 MCG/ACT NA SUSP
2.0000 | Freq: Every day | NASAL | 11 refills | Status: AC
  Filled 2022-11-09 – 2022-11-25 (×2): qty 16, 30d supply, fill #0

## 2022-11-09 MED ORDER — DULOXETINE HCL 60 MG PO CPEP
120.0000 mg | ORAL_CAPSULE | Freq: Every day | ORAL | 3 refills | Status: AC
  Filled 2022-11-09 – 2022-11-25 (×2): qty 180, 90d supply, fill #0

## 2022-11-13 ENCOUNTER — Other Ambulatory Visit: Payer: Self-pay

## 2022-11-14 ENCOUNTER — Other Ambulatory Visit: Payer: Self-pay

## 2022-11-14 ENCOUNTER — Other Ambulatory Visit (HOSPITAL_COMMUNITY): Payer: Self-pay

## 2022-11-15 ENCOUNTER — Other Ambulatory Visit: Payer: Self-pay

## 2022-11-17 ENCOUNTER — Other Ambulatory Visit (HOSPITAL_COMMUNITY): Payer: Self-pay

## 2022-11-17 ENCOUNTER — Other Ambulatory Visit: Payer: Self-pay

## 2022-11-27 ENCOUNTER — Other Ambulatory Visit (HOSPITAL_COMMUNITY): Payer: Self-pay

## 2022-11-27 ENCOUNTER — Other Ambulatory Visit: Payer: Self-pay

## 2022-11-28 ENCOUNTER — Other Ambulatory Visit: Payer: Self-pay

## 2022-11-28 ENCOUNTER — Other Ambulatory Visit (HOSPITAL_COMMUNITY): Payer: Self-pay

## 2022-12-04 ENCOUNTER — Other Ambulatory Visit: Payer: Self-pay

## 2023-05-24 ENCOUNTER — Other Ambulatory Visit: Payer: Self-pay
